# Patient Record
Sex: Female | Born: 1955 | Race: Black or African American | Hispanic: No | Marital: Married | Smoking: Never smoker
Health system: Southern US, Community
[De-identification: ages and names within clinical notes are randomized; demographics above are authoritative.]

## PROBLEM LIST (undated history)

## (undated) DIAGNOSIS — K219 Gastro-esophageal reflux disease without esophagitis: Secondary | ICD-10-CM

## (undated) DIAGNOSIS — I10 Essential (primary) hypertension: Secondary | ICD-10-CM

## (undated) DIAGNOSIS — R519 Headache, unspecified: Secondary | ICD-10-CM

## (undated) DIAGNOSIS — R51 Headache: Secondary | ICD-10-CM

## (undated) DIAGNOSIS — E119 Type 2 diabetes mellitus without complications: Secondary | ICD-10-CM

## (undated) DIAGNOSIS — E785 Hyperlipidemia, unspecified: Secondary | ICD-10-CM

## (undated) DIAGNOSIS — F419 Anxiety disorder, unspecified: Secondary | ICD-10-CM

## (undated) DIAGNOSIS — J45909 Unspecified asthma, uncomplicated: Secondary | ICD-10-CM

## (undated) DIAGNOSIS — M069 Rheumatoid arthritis, unspecified: Secondary | ICD-10-CM

## (undated) HISTORY — PX: OTHER SURGICAL HISTORY: SHX169

## (undated) HISTORY — PX: TUBAL LIGATION: SHX77

## (undated) HISTORY — DX: Anxiety disorder, unspecified: F41.9

## (undated) HISTORY — DX: Hyperlipidemia, unspecified: E78.5

## (undated) HISTORY — PX: CHOLECYSTECTOMY: SHX55

## (undated) HISTORY — PX: LITHOTRIPSY: SUR834

---

## 2015-03-23 ENCOUNTER — Ambulatory Visit: Payer: Self-pay | Admitting: Family Medicine

## 2015-04-23 ENCOUNTER — Encounter: Payer: Self-pay | Admitting: Emergency Medicine

## 2015-04-23 ENCOUNTER — Emergency Department
Admission: EM | Admit: 2015-04-23 | Discharge: 2015-04-23 | Disposition: A | Discharge status: Home / Self Care | Payer: Worker's Compensation | Attending: Emergency Medicine | Admitting: Emergency Medicine

## 2015-04-23 ENCOUNTER — Emergency Department: Payer: Worker's Compensation

## 2015-04-23 DIAGNOSIS — Y9289 Other specified places as the place of occurrence of the external cause: Secondary | ICD-10-CM | POA: Insufficient documentation

## 2015-04-23 DIAGNOSIS — S9781XA Crushing injury of right foot, initial encounter: Secondary | ICD-10-CM | POA: Diagnosis not present

## 2015-04-23 DIAGNOSIS — Y9389 Activity, other specified: Secondary | ICD-10-CM | POA: Diagnosis not present

## 2015-04-23 DIAGNOSIS — Y990 Civilian activity done for income or pay: Secondary | ICD-10-CM | POA: Diagnosis not present

## 2015-04-23 DIAGNOSIS — B351 Tinea unguium: Secondary | ICD-10-CM | POA: Insufficient documentation

## 2015-04-23 DIAGNOSIS — S99921A Unspecified injury of right foot, initial encounter: Secondary | ICD-10-CM | POA: Diagnosis present

## 2015-04-23 DIAGNOSIS — I10 Essential (primary) hypertension: Secondary | ICD-10-CM | POA: Diagnosis not present

## 2015-04-23 DIAGNOSIS — E119 Type 2 diabetes mellitus without complications: Secondary | ICD-10-CM | POA: Diagnosis not present

## 2015-04-23 HISTORY — DX: Essential (primary) hypertension: I10

## 2015-04-23 HISTORY — DX: Type 2 diabetes mellitus without complications: E11.9

## 2015-04-23 MED ORDER — IBUPROFEN 600 MG PO TABS: 600.0000 mg | ORAL_TABLET | Freq: Three times a day (TID) | ORAL | Status: DC | PRN

## 2015-04-23 MED ORDER — IBUPROFEN 600 MG PO TABS
600.0000 mg | ORAL_TABLET | Freq: Once | ORAL | Status: AC
Start: 2015-04-23 — End: 2015-04-23
  Administered 2015-04-23: 600 mg via ORAL
  Filled 2015-04-23: qty 1
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
## 2015-04-23 NOTE — Discharge Instructions (Signed)
Cryotherapy Cryotherapy is when you put ice on your injury. Ice helps lessen pain and puffiness (swelling) after an injury. Ice works the best when you start using it in the first 24 to 48 hours after an injury. HOME CARE  Put a dry or damp towel between the ice pack and your skin.  You may press gently on the ice pack.  Leave the ice on for no more than 10 to 20 minutes at a time.  Check your skin after 5 minutes to make sure your skin is okay.  Rest at least 20 minutes between ice pack uses.  Stop using ice when your skin loses feeling (numbness).  Do not use ice on someone who cannot tell you when it hurts. This includes small children and people with memory problems (dementia). GET HELP RIGHT AWAY IF:  You have white spots on your skin.  Your skin turns blue or pale.  Your skin feels waxy or hard.  Your puffiness gets worse. MAKE SURE YOU:   Understand these instructions.  Will watch your condition.  Will get help right away if you are not doing well or get worse. Document Released: 01/30/2008 Document Revised: 11/05/2011 Document Reviewed: 04/05/2011 [facility] Patient Information 2015 [place], [place]. This information is not intended to replace advice given to you by your health care provider. Make sure you discuss any questions you have with your health care provider.  Crush Injury, Fingers or Toes A crush injury means the fingers or toes are hurt by being squeezed (compressed). HOME CARE  Raise (elevate) the injured part above the level of your heart. Do this as much as you can for the first few days.  Put ice on the injured area.  Put ice in a plastic bag.  Place a towel between your skin and the bag.  Leave the ice on for 15-20 minutes, 03-04 times a day for the first 2 days.  Only take medicine as told by your doctor.  Use the injured part only as told by your doctor.  Change bandages (dressings) as told by your doctor.  Keep all doctor visits as  told. GET HELP RIGHT AWAY IF:   There is redness, puffiness (swelling), or more pain in the injured finger or toe.  Yellowish-white fluid (pus) comes from the wound.  You have a fever.  A bad smell comes from the wound or bandage.  The wound breaks open.  You cannot move the injured finger or toe. MAKE SURE YOU:   Understand these instructions.  Will watch your condition.  Will get help right away if you are not doing well or get worse. Document Released: 01/31/2010 Document Revised: 11/05/2011 Document Reviewed: 12/29/2010 [facility] Patient Information 2015 [place], [place]. This information is not intended to replace advice given to you by your health care provider. Make sure you discuss any questions you have with your health care provider.

## 2015-05-22 ENCOUNTER — Emergency Department
Admission: EM | Admit: 2015-05-22 | Discharge: 2015-05-22 | Disposition: A | Discharge status: Home / Self Care | Payer: 59 | Attending: Emergency Medicine | Admitting: Emergency Medicine

## 2015-05-22 DIAGNOSIS — M791 Myalgia: Secondary | ICD-10-CM | POA: Insufficient documentation

## 2015-05-22 DIAGNOSIS — R112 Nausea with vomiting, unspecified: Secondary | ICD-10-CM | POA: Insufficient documentation

## 2015-05-22 DIAGNOSIS — E119 Type 2 diabetes mellitus without complications: Secondary | ICD-10-CM | POA: Diagnosis not present

## 2015-05-22 DIAGNOSIS — I10 Essential (primary) hypertension: Secondary | ICD-10-CM | POA: Diagnosis not present

## 2015-05-22 DIAGNOSIS — R197 Diarrhea, unspecified: Secondary | ICD-10-CM | POA: Diagnosis present

## 2015-05-22 DIAGNOSIS — F419 Anxiety disorder, unspecified: Secondary | ICD-10-CM | POA: Insufficient documentation

## 2015-05-22 DIAGNOSIS — R109 Unspecified abdominal pain: Secondary | ICD-10-CM | POA: Insufficient documentation

## 2015-05-22 LAB — CBC
HCT: 38.2 % (ref 35.0–47.0)
Hemoglobin: 12.5 g/dL (ref 12.0–16.0)
MCH: 25.4 pg — ABNORMAL LOW (ref 26.0–34.0)
MCHC: 32.8 g/dL (ref 32.0–36.0)
MCV: 77.2 fL — ABNORMAL LOW (ref 80.0–100.0)
Platelets: 267 10*3/uL (ref 150–440)
RBC: 4.94 MIL/uL (ref 3.80–5.20)
RDW: 13.6 % (ref 11.5–14.5)
WBC: 8.7 10*3/uL (ref 3.6–11.0)

## 2015-05-22 LAB — COMPREHENSIVE METABOLIC PANEL
ALT: 23 U/L (ref 14–54)
AST: 30 U/L (ref 15–41)
Albumin: 4 g/dL (ref 3.5–5.0)
Alkaline Phosphatase: 94 U/L (ref 38–126)
Anion gap: 7 (ref 5–15)
BUN: 17 mg/dL (ref 6–20)
CO2: 28 mmol/L (ref 22–32)
Calcium: 9 mg/dL (ref 8.9–10.3)
Chloride: 103 mmol/L (ref 101–111)
Creatinine, Ser: 0.91 mg/dL (ref 0.44–1.00)
GFR calc Af Amer: >60 mL/min (ref >60)
GFR calc non Af Amer: >60 mL/min (ref >60)
Glucose, Bld: 162 mg/dL — ABNORMAL HIGH (ref 65–99)
Potassium: 3.4 mmol/L — ABNORMAL LOW (ref 3.5–5.1)
Sodium: 138 mmol/L (ref 135–145)
Total Bilirubin: 0.7 mg/dL (ref 0.3–1.2)
Total Protein: 8.2 g/dL — ABNORMAL HIGH (ref 6.5–8.1)

## 2015-05-22 MED ORDER — ONDANSETRON HCL 4 MG/2ML IJ SOLN
4.0000 mg | Freq: Once | INTRAMUSCULAR | Status: AC
  Administered 2015-05-22: 4 mg via INTRAVENOUS
  Filled 2015-05-22: qty 2

## 2015-05-22 MED ORDER — SODIUM CHLORIDE 0.9 % IV SOLN
1000.0000 mL | Freq: Once | INTRAVENOUS | Status: AC
  Administered 2015-05-22: 1000 mL via INTRAVENOUS

## 2015-05-22 NOTE — ED Provider Notes (Signed)
[facility] Emergency Department Provider Note  ____________________________________________  Time seen: On arrival  I have reviewed the triage vital signs and the nursing notes.   HISTORY  Chief Complaint Nausea; Emesis; and Diarrhea    HPI April Bauer is a 59 y.o. female who presents with complaints of nausea and abdominal cramping and diarrhea. She reports yesterday she developed nausea and then today developed diarrhea which is been frequent and watery. She denies fevers chills. She has had some myalgias. No sick contacts.She does have a history of diabetes. No recent antibiotics. No exposure to C. difficile     Past Medical History  Diagnosis Date  . Diabetes mellitus without complication   . Hypertension     There are no active problems to display for this patient.   Past Surgical History  Procedure Laterality Date  . Cholecystectomy    . Rotary cuff    . Tubal ligation      Current Outpatient Rx  Name  Route  Sig  Dispense  Refill  . ibuprofen (ADVIL,MOTRIN) 600 MG tablet   Oral   Take 1 tablet (600 mg total) by mouth every 8 (eight) hours as needed for moderate pain.   21 tablet   0     Allergies Tramadol and Vicodin  No family history on file.  Social History Social History  Substance Use Topics  . Smoking status: Never Smoker   . Smokeless tobacco: Not on file  . Alcohol Use: No    Review of Systems  Constitutional: Negative for fever. Eyes: Negative for visual changes. ENT: Negative for sore throat Cardiovascular: Negative for chest pain. Respiratory: Negative for shortness of breath. Gastrointestinal: Positive for nausea and diarrhea Genitourinary: Negative for dysuria. Musculoskeletal: Negative for back pain. Skin: Negative for rash. Neurological: Negative for headaches or focal weakness Psychiatric: Mild anxiety    ____________________________________________   PHYSICAL EXAM:  VITAL SIGNS: ED  Triage Vitals  Enc Vitals Group     BP 05/22/15 2012 173/89 mmHg     Pulse Rate 05/22/15 2012 79     Resp 05/22/15 2012 18     Temp 05/22/15 2012 98.2 F (36.8 C)     Temp Source 05/22/15 2012 Oral     SpO2 05/22/15 2012 97 %     Weight 05/22/15 2012 210 lb (95.255 kg)     Height 05/22/15 2012 5\' 6"  (1.676 m)     Head Cir --      Peak Flow --      Pain Score 05/22/15 2034 8     Pain Loc --      Pain Edu? --      Excl. in GC? --      Constitutional: Alert and oriented. Well appearing and in no distress. Eyes: Conjunctivae are normal.  ENT   Head: Normocephalic and atraumatic.   Mouth/Throat: Mucous membranes are moist. Cardiovascular: Normal rate, regular rhythm. Normal and symmetric distal pulses are present in all extremities. No murmurs, rubs, or gallops. Respiratory: Normal respiratory effort without tachypnea nor retractions. Breath sounds are clear and equal bilaterally.  Gastrointestinal: Soft and non-tender in all quadrants. No distention. There is no CVA tenderness. Genitourinary: deferred Musculoskeletal: Nontender with normal range of motion in all extremities. No lower extremity tenderness nor edema. Neurologic:  Normal speech and language. No gross focal neurologic deficits are appreciated. Skin:  Skin is warm, dry and intact. No rash noted. Psychiatric: Mood and affect are normal. Patient exhibits appropriate insight and judgment.  ____________________________________________    LABS (pertinent positives/negatives)  Labs Reviewed  CBC - Abnormal; Notable for the following:    MCV 77.2 (*)    MCH 25.4 (*)    All other components within normal limits  COMPREHENSIVE METABOLIC PANEL - Abnormal; Notable for the following:    Potassium 3.4 (*)    Glucose, Bld 162 (*)    Total Protein 8.2 (*)    All other components within normal limits    ____________________________________________   EKG  None  ____________________________________________     RADIOLOGY I have personally reviewed any xrays that were ordered on this patient: None  ____________________________________________   PROCEDURES  Procedure(s) performed: none  Critical Care performed: none  ____________________________________________   INITIAL IMPRESSION / ASSESSMENT AND PLAN / ED COURSE  Pertinent labs & imaging results that were available during my care of the patient were reviewed by me and considered in my medical decision making (see chart for details).  Patient's history of present illness is most consistent with a viral gastroenteritis. She has no tenderness to palpation and her exam is benign. Her vital signs are normal. Her lab work is unremarkable. She feels significant better after fluids and Zofran. I will discharge patient with Zofran prescription and PCP follow-up as needed  ____________________________________________   FINAL CLINICAL IMPRESSION(S) / ED DIAGNOSES  Final diagnoses:  Diarrhea     Jene Every, MD 05/22/15 2245

## 2015-05-22 NOTE — Discharge Instructions (Signed)
Diarrhea °Diarrhea is watery poop (stool). It can make you feel weak, tired, thirsty, or give you a dry mouth (signs of dehydration). Watery poop is a sign of another problem, most often an infection. It often lasts 2-3 days. It can last longer if it is a sign of something serious. Take care of yourself as told by your doctor. °HOME CARE  °· Drink 1 cup (8 ounces) of fluid each time you have watery poop. °· Do not drink the following fluids: °¨ Those that contain simple sugars (fructose, glucose, galactose, lactose, sucrose, maltose). °¨ Sports drinks. °¨ Fruit juices. °¨ Whole milk products. °¨ Sodas. °¨ Drinks with caffeine (coffee, tea, soda) or alcohol. °· Oral rehydration solution may be used if the doctor says it is okay. You may make your own solution. Follow this recipe: °¨  - teaspoon table salt. °¨ ¾ teaspoon baking soda. °¨  teaspoon salt substitute containing potassium chloride. °¨ 1 tablespoons sugar. °¨ 1 liter (34 ounces) of water. °· Avoid the following foods: °¨ High fiber foods, such as raw fruits and vegetables. °¨ Nuts, seeds, and whole grain breads and cereals. °¨  Those that are sweetened with sugar alcohols (xylitol, sorbitol, mannitol). °· Try eating the following foods: °¨ Starchy foods, such as rice, toast, pasta, low-sugar cereal, oatmeal, baked potatoes, crackers, and bagels. °¨ Bananas. °¨ Applesauce. °· Eat probiotic-rich foods, such as yogurt and milk products that are fermented. °· Wash your hands well after each time you have watery poop. °· Only take medicine as told by your doctor. °· Take a warm bath to help lessen burning or pain from having watery poop. °GET HELP RIGHT AWAY IF:  °· You cannot drink fluids without throwing up (vomiting). °· You keep throwing up. °· You have blood in your poop, or your poop looks black and tarry. °· You do not pee (urinate) in 6-8 hours, or there is only a small amount of very dark pee. °· You have belly (abdominal) pain that gets worse or stays  in the same spot (localizes). °· You are weak, dizzy, confused, or light-headed. °· You have a very bad headache. °· Your watery poop gets worse or does not get better. °· You have a fever or lasting symptoms for more than 2-3 days. °· You have a fever and your symptoms suddenly get worse. °MAKE SURE YOU:  °· Understand these instructions. °· Will watch your condition. °· Will get help right away if you are not doing well or get worse. °Document Released: 01/30/2008 Document Revised: 12/28/2013 Document Reviewed: 04/20/2012 °[employer]® Patient Information ©2015 [employer]. This information is not intended to replace advice given to you by your health care provider. Make sure you discuss any questions you have with your health care provider. ° °

## 2015-05-22 NOTE — ED Notes (Signed)
Patient reports nausea for 2 days, then started vomiting.  Today with multiple bouts of diarrhea.

## 2015-06-06 ENCOUNTER — Ambulatory Visit (INDEPENDENT_AMBULATORY_CARE_PROVIDER_SITE_OTHER): Payer: 59 | Admitting: Family Medicine

## 2015-06-06 ENCOUNTER — Encounter: Payer: Self-pay | Admitting: Family Medicine

## 2015-06-06 VITALS — BP 172/90 | HR 70 | Temp 97.6°F | Ht 64.4 in | Wt 199.0 lb

## 2015-06-06 DIAGNOSIS — F419 Anxiety disorder, unspecified: Secondary | ICD-10-CM | POA: Diagnosis not present

## 2015-06-06 DIAGNOSIS — E119 Type 2 diabetes mellitus without complications: Secondary | ICD-10-CM | POA: Diagnosis not present

## 2015-06-06 DIAGNOSIS — R197 Diarrhea, unspecified: Secondary | ICD-10-CM | POA: Diagnosis not present

## 2015-06-06 DIAGNOSIS — Z113 Encounter for screening for infections with a predominantly sexual mode of transmission: Secondary | ICD-10-CM | POA: Diagnosis not present

## 2015-06-06 DIAGNOSIS — E785 Hyperlipidemia, unspecified: Secondary | ICD-10-CM

## 2015-06-06 DIAGNOSIS — I10 Essential (primary) hypertension: Secondary | ICD-10-CM | POA: Diagnosis not present

## 2015-06-06 LAB — MICROALBUMIN, URINE WAIVED
Creatinine, Urine Waived: 200 mg/dL (ref 10–300)
Microalb, Ur Waived: 80 mg/L — ABNORMAL HIGH (ref 0–19)

## 2015-06-06 LAB — LIPID PANEL PICCOLO, WAIVED
Chol/HDL Ratio Piccolo,Waive: 5.4 mg/dL — ABNORMAL HIGH
Cholesterol Piccolo, Waived: 178 mg/dL (ref <200)
HDL Chol Piccolo, Waived: 33 mg/dL — ABNORMAL LOW (ref >59)
LDL Chol Calc Piccolo Waived: 93 mg/dL (ref <100)
Triglycerides Piccolo,Waived: 263 mg/dL — ABNORMAL HIGH (ref <150)
VLDL Chol Calc Piccolo,Waive: 53 mg/dL — ABNORMAL HIGH (ref <30)

## 2015-06-06 LAB — BAYER DCA HB A1C WAIVED: HB A1C (BAYER DCA - WAIVED): 8 % — ABNORMAL HIGH (ref <7.0)

## 2015-06-06 MED ORDER — BLOOD GLUCOSE MONITOR KIT: PACK | Status: DC

## 2015-06-06 MED ORDER — GLIPIZIDE ER 10 MG PO TB24: 10.0000 mg | ORAL_TABLET | Freq: Every day | ORAL | Status: DC

## 2015-06-06 NOTE — Assessment & Plan Note (Signed)
Stable at this time. Continue to monitor. Call with any changes.

## 2015-06-06 NOTE — Assessment & Plan Note (Addendum)
A1c is 8.0 today, but she has been out of her glipizide. Work on diet and exercise. Monitor. Recheck 3 months.

## 2015-06-06 NOTE — Assessment & Plan Note (Signed)
Under good control on current regimen at this time. Continue current regimen. Continue to monitor.

## 2015-06-06 NOTE — Progress Notes (Signed)
BP 177/102 mmHg  Pulse 70  Temp(Src) 97.6 F (36.4 C)  Ht 5' 4.4" (1.636 m)  Wt 199 lb (90.266 kg)  BMI 33.73 kg/m2  SpO2 99%   Subjective:    Patient ID: April Bauer, female    DOB: 08/21/1956, 59 y.o.   MRN: [id number]  HPI: April Bauer is a 59 y.o. female has not seen her doctor in the past 6 months  Chief Complaint  Patient presents with  . Establish Care    No problems, patient will schedule a CPE   HYPERTENSION / HYPERLIPIDEMIA Satisfied with current treatment? yes- hasn't taken her medicine yet today Duration of hypertension: chronic BP monitoring frequency: not checking BP medication side effects: no Duration of hyperlipidemia: chronic Cholesterol medication side effects: no Cholesterol supplements: none Medication compliance: fair Aspirin: yes Recent stressors: no Recurrent headaches: no Visual changes: yes Palpitations: no Dyspnea: no Chest pain: no- has a lot of gas Lower extremity edema: yes after an injury Dizzy/lightheaded: no  DIABETES Hypoglycemic episodes: yes Polydipsia/polyuria: no Visual disturbance: yes Chest pain: no Paresthesias: yes Glucose Monitoring: no Taking Insulin?: no Blood Pressure Monitoring: not checking Retinal Examination: Up to Date Foot Exam: Up to Date Diabetic Education: Completed Pneumovax: refused Influenza: refused Aspirin: yes  ABDOMINAL ISSUES Duration: for the last 4-5 months 1-2x a month has diarrhea Nature: bloating, sharp and cramping Location: LLQ  Severity: moderate  Radiation: no Episode duration: 2-3 days Frequency: a few times a month Alleviating factors: water Aggravating factors: anything Treatments attempted: none Constipation: no Diarrhea: yes Episodes of diarrhea/day: 4x a day Mucous in the stool: no Heartburn: yes Bloating:yes Flatulence: yes Nausea: yes Vomiting: no Melena or hematochezia: no Rash: no Jaundice: no Fever: no Weight loss: yes about 10 lbs  Relevant past medical,  surgical, family and social history reviewed and updated as indicated. Interim medical history since our last visit reviewed. Allergies and medications reviewed and updated.  Review of Systems  Constitutional: Negative.   HENT: Negative.   Respiratory: Negative.   Cardiovascular: Negative.   Gastrointestinal: Positive for abdominal pain, diarrhea and abdominal distention. Negative for nausea, vomiting, constipation, blood in stool, anal bleeding and rectal pain.  Psychiatric/Behavioral: Negative.    Per HPI unless specifically indicated above     Objective:    BP 177/102 mmHg  Pulse 70  Temp(Src) 97.6 F (36.4 C)  Ht 5' 4.4" (1.636 m)  Wt 199 lb (90.266 kg)  BMI 33.73 kg/m2  SpO2 99%  Wt Readings from Last 3 Encounters:  06/06/15 199 lb (90.266 kg)  05/22/15 210 lb (95.255 kg)  04/23/15 200 lb (90.719 kg)    Physical Exam  Constitutional: She is oriented to person, place, and time. She appears well-developed and well-nourished. No distress.  HENT:  Head: Normocephalic and atraumatic.  Right Ear: Hearing normal.  Left Ear: Hearing normal.  Nose: Nose normal.  Eyes: Conjunctivae and lids are normal. Right eye exhibits no discharge. Left eye exhibits no discharge. No scleral icterus.  Cardiovascular: Normal rate, regular rhythm, normal heart sounds and intact distal pulses.  Exam reveals no gallop and no friction rub.   No murmur heard. Pulmonary/Chest: Effort normal and breath sounds normal. No respiratory distress. She has no wheezes. She has no rales. She exhibits no tenderness.  Abdominal: Soft. Bowel sounds are normal. She exhibits no distension and no mass. There is no tenderness. There is no rebound and no guarding.  Musculoskeletal: Normal range of motion.  Neurological: She is alert and oriented to  person, place, and time.  Skin: Skin is warm, dry and intact. No rash noted. She is not diaphoretic. No erythema. No pallor.  Psychiatric: She has a normal mood and  affect. Her speech is normal and behavior is normal. Judgment and thought content normal. Cognition and memory are normal.  Nursing note and vitals reviewed.   Results for orders placed or performed during the hospital encounter of 05/22/15  CBC  Result Value Ref Range   WBC 8.7 3.6 - 11.0 K/uL   RBC 4.94 3.80 - 5.20 MIL/uL   Hemoglobin 12.5 12.0 - 16.0 g/dL   HCT 74.2 59.5 - 63.8 %   MCV 77.2 (L) 80.0 - 100.0 fL   MCH 25.4 (L) 26.0 - 34.0 pg   MCHC 32.8 32.0 - 36.0 g/dL   RDW 75.6 43.3 - 29.5 %   Platelets 267 150 - 440 K/uL  Comprehensive metabolic panel  Result Value Ref Range   Sodium 138 135 - 145 mmol/L   Potassium 3.4 (L) 3.5 - 5.1 mmol/L   Chloride 103 101 - 111 mmol/L   CO2 28 22 - 32 mmol/L   Glucose, Bld 162 (H) 65 - 99 mg/dL   BUN 17 6 - 20 mg/dL   Creatinine, Ser 1.88 0.44 - 1.00 mg/dL   Calcium 9.0 8.9 - 41.6 mg/dL   Total Protein 8.2 (H) 6.5 - 8.1 g/dL   Albumin 4.0 3.5 - 5.0 g/dL   AST 30 15 - 41 U/L   ALT 23 14 - 54 U/L   Alkaline Phosphatase 94 38 - 126 U/L   Total Bilirubin 0.7 0.3 - 1.2 mg/dL   GFR calc non Af Amer >60 >60 mL/min   GFR calc Af Amer >60 >60 mL/min   Anion gap 7 5 - 15      Assessment & Plan:   Problem List Items Addressed This Visit      Cardiovascular and Mediastinum   Hypertension - Primary    Did not take her medicine today. Will check BP at home and call if high. Restart medicine and recheck next visit.       Relevant Medications   cloNIDine (CATAPRES) 0.1 MG tablet   lisinopril-hydrochlorothiazide (PRINZIDE,ZESTORETIC) 20-25 MG tablet   simvastatin (ZOCOR) 20 MG tablet   aspirin EC 81 MG tablet   Other Relevant Orders   CBC with Differential/Platelet   Comprehensive metabolic panel   Microalbumin, Urine Waived     Endocrine   Diabetes mellitus without complication (HCC)    A1c is 8.0 today, but she has been out of her glipizide. Work on diet and exercise. Monitor. Recheck 3 months.       Relevant Medications    Liraglutide (VICTOZA) 18 MG/3ML SOPN   metFORMIN (GLUCOPHAGE) 850 MG tablet   lisinopril-hydrochlorothiazide (PRINZIDE,ZESTORETIC) 20-25 MG tablet   simvastatin (ZOCOR) 20 MG tablet   aspirin EC 81 MG tablet   glipiZIDE (GLUCOTROL XL) 10 MG 24 hr tablet   Other Relevant Orders   CBC with Differential/Platelet   Comprehensive metabolic panel   TSH   Microalbumin, Urine Waived   Bayer DCA Hb A1c Waived     Other   Hyperlipidemia    Under good control on current regimen at this time. Continue current regimen. Continue to monitor.       Relevant Medications   cloNIDine (CATAPRES) 0.1 MG tablet   lisinopril-hydrochlorothiazide (PRINZIDE,ZESTORETIC) 20-25 MG tablet   simvastatin (ZOCOR) 20 MG tablet   aspirin EC 81 MG tablet  Other Relevant Orders   Comprehensive metabolic panel   Lipid Panel Piccolo, Waived   Anxiety    Stable at this time. Continue to monitor. Call with any changes.       Relevant Orders   CBC with Differential/Platelet   TSH    Other Visit Diagnoses    Routine screening for STI (sexually transmitted infection)        Checking labs today. Await results.     Relevant Orders    Hepatitis C Antibody    HIV antibody    Diarrhea, unspecified type        Of unknown cause. Will keep diary. Will check labs. Will try BRAT diet. Follow up in 1 month, if not better, stool studies. Will obtain records of colonoscopy.        Follow up plan: Return 1-3 months, for Physical- Needs Records releases.

## 2015-06-06 NOTE — Patient Instructions (Addendum)
Keep track of your belly pain and diarrhea- keep a journal to see if it's associated with any food or activity. We'll check your blood work and wait to see if that has anything to do with it. I've included a gentle diet below.   Diabetes and Exercise Diabetes mellitus is a common, chronic disease in which a person's blood sugar (glucose) levels are often too high. Getting exercise on a regular basis is an important part of diabetes treatment. WHY SHOULD I EXERCISE REGULARLY IF I HAVE DIABETES? Exercising regularly provides many benefits for people who have diabetes. Some of these benefits include:  Lowering your blood glucose level.  Maintaining a healthy body weight and structure.  Reducing your risk of heart disease by:  Lowering your LDL cholesterol levels. LDL is sometimes called bad cholesterol.  Lowering your triglyceride levels.  Raising your HDL cholesterol levels. HDL is sometimes called good cholesterol.  Decreasing your blood pressure.  Lowering your hemoglobin A1C levels. A1C is a blood test that determines your average blood glucose level over a 58-month period. Doing resistance exercises on a regular basis can help to lower these levels.  Improving your body's ability to use insulin and glucose. WHAT TYPES OF EXERCISE ARE RECOMMENDED FOR PEOPLE WHO HAVE DIABETES? A combination of resistance exercises and aerobic exercise is recommended for people who have diabetes. Resistance exercises are those that you do with free weights or weight machines. Aerobic exercise includes any exercise that raises your heart rate and breathing rate for a sustained amount of time. This can include activities such as:  Brisk walking.  Water or dry-land aerobics.  Bicycling or riding a stationary bike.  Jogging.  Dancing.  Hiking.  Moderate to vigorous gardening. Ask your health care provider what types of exercise are safe for you. HOW LONG AND HOW OFTEN SHOULD I EXERCISE? The  American Diabetes Association and the [place] Department of Health recommend the following:  Children who have diabetes or are at risk of developing diabetes (have prediabetes) should get 1 hour or more of exercise every day.  Adults who have diabetes should exercise with moderate intensity for 150 minutes or more per week. Moderate-intensity exercise is any exercise that raises your heart rate to 50-70% of your maximum heart rate. Ask your health care provider how to calculate this.  Adults who have diabetes should spread exercise over at least 3 days per week, but they should not go more than 2 days in a row without exercising.  Adults who have diabetes should do resistance exercise at least 2 days per week.  Adults who have diabetes should avoid sitting for more than 90 minutes at a time. WHAT SHOULD I DO BEFORE I START AN EXERCISE PROGRAM?  Talk with your health care provider before you start a new exercise program. If you have, or are at risk for, certain medical conditions, you may need to have a physical exam and testing. Testing may include:  A chest X-ray.  An electrocardiogram (ECG). This test checks the electrical functioning of your heart.  Blood tests.  If you have type 1 diabetes, talk with your health care provider about managing your blood glucose levels with insulin before, during, and after exercise.  Understand that exercise affects blood glucose levels differently depending on how long you exercise and depending on other factors such as whether you are sick. Be ready to treat high blood glucose (hyperglycemia) and low blood glucose (hypoglycemia) right away if either occurs during or after exercise.  Talk with your health care provider about diabetes-related problems that can occur during exercise. Your health care provider can help you to decide on an exercise plan that allows you to avoid these problems. This plan might include instructions about choosing the proper  footwear for exercise and staying well hydrated during and after exercise. HOW CAN I MANAGE MY BLOOD GLUCOSE LEVEL WHILE I EXERCISE?  Eat 1-3 hours before you exercise.  Check your blood glucose level immediately before and after exercise.  Do not start exercising if:  Your blood glucose is more than 250 mg/dL andyou do not feel well.  You have ketones in your urine.  Your blood glucose is less than 100 mg/dL.  If you do not feel well while exercising, take a break and check your blood glucose.  Stop exercising if you find that your blood glucose has dropped below 100 mg/dL. If this occurs, do the following:  Eat a 15-gram to 20-gram carbohydrate-rich snack.  Recheck your blood glucose level.  If your blood glucose level does not rise above 100 mg/dL, have another 10-GYIR to 20-gram carbohydrate snack.  Stop exercising if youfindthat your blood glucose has risen above 250 mg/dL while exercising.  Do not go on with your workout until:  Your blood glucose level rises above 100 mg/dL if it was below this level.  Your blood glucose level drops below 250 mg/dL if it was above this level.  If you take insulin, you may need to alter your insulin schedule on the days that you exercise. This depends on how your blood glucose responds to exercise. Ask your health care provider how to do this.  Drink fluids during and after exercise to avoid dehydration. For exercise that lasts a long time, use a sports drink to maintain your glucose level. SEEK MEDICAL CARE IF:  You have stopped exercising because of hypoglycemia and your blood glucose does not rise above 100 mg/dL after you have two 48-NIOE to 20-gram carbohydrate-rich snacks.  You notice a loss of sensation in your feet during or after exercise.  You have increased numbness, tingling, or pins-and-needles sensations after exercise.  You have a fast, irregular heartbeat (palpitations) during or after exercise.  Your exercise  tolerance gets worse.  You have dizzy spells or feel faint for brief periods during or after exercise. SEEK IMMEDIATE MEDICAL CARE IF:  You have chest pain during or after exercise.  You pass out (lose consciousness) during or after exercise.  You have the following in addition to hyperglycemia:  Fatigue.  Dry or flushed skin.  Difficulty breathing.  Confusion.  Nausea, vomiting, or pain in the abdomen.  Fruity-smelling breath.   This information is not intended to replace advice given to you by your health care provider. Make sure you discuss any questions you have with your health care provider.   Document Released: 08/13/2005 Document Revised: 05/04/2015 Document Reviewed: 10/12/2014 Elsevier Interactive Patient Education 2016 [employer]. Food Choices to Help Relieve Diarrhea, Adult When you have diarrhea, the foods you eat and your eating habits are very important. Choosing the right foods and drinks can help relieve diarrhea. Also, because diarrhea can last up to 7 days, you need to replace lost fluids and electrolytes (such as sodium, potassium, and chloride) in order to help prevent dehydration.  WHAT GENERAL GUIDELINES DO I NEED TO FOLLOW?  Slowly drink 1 cup (8 oz) of fluid for each episode of diarrhea. If you are getting enough fluid, your urine will be clear or  pale yellow.  Eat starchy foods. Some good choices include white rice, white toast, pasta, low-fiber cereal, baked potatoes (without the skin), saltine crackers, and bagels.  Avoid large servings of any cooked vegetables.  Limit fruit to two servings per day. A serving is  cup or 1 small piece.  Choose foods with less than 2 g of fiber per serving.  Limit fats to less than 8 tsp (38 g) per day.  Avoid fried foods.  Eat foods that have probiotics in them. Probiotics can be found in certain dairy products.  Avoid foods and beverages that may increase the speed at which food moves through the stomach  and intestines (gastrointestinal tract). Things to avoid include:  High-fiber foods, such as dried fruit, raw fruits and vegetables, nuts, seeds, and whole grain foods.  Spicy foods and high-fat foods.  Foods and beverages sweetened with high-fructose corn syrup, honey, or sugar alcohols such as xylitol, sorbitol, and mannitol. WHAT FOODS ARE RECOMMENDED? Grains White rice. White, [place], or pita breads (fresh or toasted), including plain rolls, buns, or bagels. White pasta. Saltine, soda, or graham crackers. Pretzels. Low-fiber cereal. Cooked cereals made with water (such as cornmeal, farina, or cream cereals). Plain muffins. Matzo. Melba toast. Zwieback.  Vegetables Potatoes (without the skin). Strained tomato and vegetable juices. Most well-cooked and canned vegetables without seeds. Tender lettuce. Fruits Cooked or canned applesauce, apricots, cherries, fruit cocktail, grapefruit, peaches, pears, or plums. Fresh bananas, apples without skin, cherries, grapes, cantaloupe, grapefruit, peaches, oranges, or plums.  Meat and Other Protein Products Baked or boiled chicken. Eggs. Tofu. Fish. Seafood. Smooth peanut butter. Ground or well-cooked tender beef, ham, veal, lamb, pork, or poultry.  Dairy Plain yogurt, kefir, and unsweetened liquid yogurt. Lactose-free milk, buttermilk, or soy milk. Plain hard cheese. Beverages Sport drinks. Clear broths. Diluted fruit juices (except prune). Regular, caffeine-free sodas such as ginger ale. Water. Decaffeinated teas. Oral rehydration solutions. Sugar-free beverages not sweetened with sugar alcohols. Other Bouillon, broth, or soups made from recommended foods.  The items listed above may not be a complete list of recommended foods or beverages. Contact your dietitian for more options. WHAT FOODS ARE NOT RECOMMENDED? Grains Whole grain, whole wheat, bran, or rye breads, rolls, pastas, crackers, and cereals. Wild or brown rice. Cereals that contain more  than 2 g of fiber per serving. Corn tortillas or taco shells. Cooked or dry oatmeal. Granola. Popcorn. Vegetables Raw vegetables. Cabbage, broccoli, Brussels sprouts, artichokes, baked beans, beet greens, corn, kale, legumes, peas, sweet potatoes, and yams. Potato skins. Cooked spinach and cabbage. Fruits Dried fruit, including raisins and dates. Raw fruits. Stewed or dried prunes. Fresh apples with skin, apricots, mangoes, pears, raspberries, and strawberries.  Meat and Other Protein Products Chunky peanut butter. Nuts and seeds. Beans and lentils. Tomasa Blase.  Dairy High-fat cheeses. Milk, chocolate milk, and beverages made with milk, such as milk shakes. Cream. Ice cream. Sweets and Desserts Sweet rolls, doughnuts, and sweet breads. Pancakes and waffles. Fats and Oils Butter. Cream sauces. Margarine. Salad oils. Plain salad dressings. Olives. Avocados.  Beverages Caffeinated beverages (such as coffee, tea, soda, or energy drinks). Alcoholic beverages. Fruit juices with pulp. Prune juice. Soft drinks sweetened with high-fructose corn syrup or sugar alcohols. Other Coconut. Hot sauce. Chili powder. Mayonnaise. Gravy. Cream-based or milk-based soups.  The items listed above may not be a complete list of foods and beverages to avoid. Contact your dietitian for more information. WHAT SHOULD I DO IF I BECOME DEHYDRATED? Diarrhea can sometimes lead to  dehydration. Signs of dehydration include dark urine and dry mouth and skin. If you think you are dehydrated, you should rehydrate with an oral rehydration solution. These solutions can be purchased at pharmacies, retail stores, or online.  Drink -1 cup (120-240 mL) of oral rehydration solution each time you have an episode of diarrhea. If drinking this amount makes your diarrhea worse, try drinking smaller amounts more often. For example, drink 1-3 tsp (5-15 mL) every 5-10 minutes.  A general rule for staying hydrated is to drink 1-2 L of fluid per day.  Talk to your health care provider about the specific amount you should be drinking each day. Drink enough fluids to keep your urine clear or pale yellow.   This information is not intended to replace advice given to you by your health care provider. Make sure you discuss any questions you have with your health care provider.   Document Released: 11/03/2003 Document Revised: 09/03/2014 Document Reviewed: 07/06/2013 Elsevier Interactive Patient Education [employer]

## 2015-06-06 NOTE — Assessment & Plan Note (Signed)
Did not take her medicine today. Will check BP at home and call if high. Restart medicine and recheck next visit.

## 2015-06-07 LAB — CBC WITH DIFFERENTIAL/PLATELET
Basophils Absolute: 0.1 10*3/uL (ref 0.0–0.2)
Basos: 1 %
EOS (ABSOLUTE): 0.5 10*3/uL — ABNORMAL HIGH (ref 0.0–0.4)
Eos: 6 %
Hematocrit: 37.2 % (ref 34.0–46.6)
Hemoglobin: 11.9 g/dL (ref 11.1–15.9)
Immature Grans (Abs): 0 10*3/uL (ref 0.0–0.1)
Immature Granulocytes: 0 %
Lymphocytes Absolute: 3.1 10*3/uL (ref 0.7–3.1)
Lymphs: 36 %
MCH: 25.3 pg — ABNORMAL LOW (ref 26.6–33.0)
MCHC: 32 g/dL (ref 31.5–35.7)
MCV: 79 fL (ref 79–97)
Monocytes Absolute: 0.7 10*3/uL (ref 0.1–0.9)
Monocytes: 8 %
Neutrophils Absolute: 4.3 10*3/uL (ref 1.4–7.0)
Neutrophils: 49 %
Platelets: 364 10*3/uL (ref 150–379)
RBC: 4.71 x10E6/uL (ref 3.77–5.28)
RDW: 13.1 % (ref 12.3–15.4)
WBC: 8.6 10*3/uL (ref 3.4–10.8)

## 2015-06-07 LAB — COMPREHENSIVE METABOLIC PANEL
ALT: 26 IU/L (ref 0–32)
AST: 15 IU/L (ref 0–40)
Albumin/Globulin Ratio: 1.3 (ref 1.1–2.5)
Albumin: 4.1 g/dL (ref 3.5–5.5)
Alkaline Phosphatase: 107 IU/L (ref 39–117)
BUN/Creatinine Ratio: 18 (ref 9–23)
BUN: 14 mg/dL (ref 6–24)
Bilirubin Total: 0.3 mg/dL (ref 0.0–1.2)
CO2: 28 mmol/L (ref 18–29)
Calcium: 9.5 mg/dL (ref 8.7–10.2)
Chloride: 101 mmol/L (ref 97–108)
Creatinine, Ser: 0.76 mg/dL (ref 0.57–1.00)
GFR calc Af Amer: 100 mL/min/{1.73_m2} (ref >59)
GFR calc non Af Amer: 87 mL/min/{1.73_m2} (ref >59)
Globulin, Total: 3.1 g/dL (ref 1.5–4.5)
Glucose: 151 mg/dL — ABNORMAL HIGH (ref 65–99)
Potassium: 3.9 mmol/L (ref 3.5–5.2)
Sodium: 142 mmol/L (ref 134–144)
Total Protein: 7.2 g/dL (ref 6.0–8.5)

## 2015-06-07 LAB — TSH: TSH: 0.952 u[IU]/mL (ref 0.450–4.500)

## 2015-06-07 LAB — HEPATITIS C ANTIBODY: Hep C Virus Ab: <0.1 s/co ratio (ref 0.0–0.9)

## 2015-06-07 LAB — HIV ANTIBODY (ROUTINE TESTING W REFLEX): HIV Screen 4th Generation wRfx: NONREACTIVE

## 2015-06-08 ENCOUNTER — Encounter: Payer: Self-pay | Admitting: Family Medicine

## 2015-06-09 ENCOUNTER — Telehealth: Payer: Self-pay | Admitting: Family Medicine

## 2015-06-09 ENCOUNTER — Other Ambulatory Visit: Payer: Self-pay

## 2015-06-09 NOTE — Telephone Encounter (Signed)
Please send meter and test strip rx to [facility] [place], they say they don't have it

## 2015-06-09 NOTE — Telephone Encounter (Signed)
Called meter and test strips into [facility] [place], Left on the answering machine.

## 2015-06-20 ENCOUNTER — Other Ambulatory Visit: Payer: Self-pay

## 2015-06-20 NOTE — Telephone Encounter (Signed)
Patient requests  One Touch ultra strip blue Lisinopril/hctz  Clonidine. Requests all medication w/ 90 day supply. (mail delivery)  Last visit was 06/06/2015

## 2015-06-20 NOTE — Telephone Encounter (Signed)
Need to know dose of her catapres. Then I'll refill

## 2015-06-21 MED ORDER — CLONIDINE HCL 0.1 MG PO TABS: 0.1000 mg | ORAL_TABLET | Freq: Two times a day (BID) | ORAL | Status: DC

## 2015-06-21 MED ORDER — GLUCOSE BLOOD VI STRP: ORAL_STRIP | Status: DC

## 2015-06-21 MED ORDER — LISINOPRIL-HYDROCHLOROTHIAZIDE 20-25 MG PO TABS: 1.0000 | ORAL_TABLET | Freq: Every day | ORAL | Status: DC

## 2015-06-21 NOTE — Telephone Encounter (Signed)
Dose added to order.  Dose is 0.1mg 

## 2015-07-18 ENCOUNTER — Telehealth: Payer: Self-pay

## 2015-07-18 ENCOUNTER — Other Ambulatory Visit: Payer: Self-pay

## 2015-07-18 NOTE — Telephone Encounter (Signed)
LAST VISIT: 06/06/2015 APPT: 08/02/2015  I saw where April Bauer had an open encounter. I talked to her and she said to do route these two because these are from [facility] not her normal pharmacy [facility]) None of her other medications have [facility]ed through with [facility].

## 2015-07-18 NOTE — Telephone Encounter (Signed)
Patient has an appointment coming up on 12/6- should be OK until then. BP was not in control, so don't want to give her 90 day supply if we have to change it. Please let me know if she's totally out and I'll get her enough to get to her appointment.

## 2015-07-18 NOTE — Telephone Encounter (Signed)
Patient states that she needs a refill for ALL medications sent to [facility]- phone : [phone].  Please call to let patient know when refills have been submitted @ [phone] (pt states okay to leave message on voice mail)

## 2015-07-19 NOTE — Telephone Encounter (Signed)
Patient notified

## 2015-08-02 ENCOUNTER — Ambulatory Visit (INDEPENDENT_AMBULATORY_CARE_PROVIDER_SITE_OTHER): Payer: 59 | Admitting: Family Medicine

## 2015-08-02 ENCOUNTER — Encounter: Payer: Self-pay | Admitting: Family Medicine

## 2015-08-02 VITALS — BP 210/72 | HR 70 | Temp 97.7°F | Ht 64.1 in | Wt 201.0 lb

## 2015-08-02 DIAGNOSIS — I129 Hypertensive chronic kidney disease with stage 1 through stage 4 chronic kidney disease, or unspecified chronic kidney disease: Secondary | ICD-10-CM

## 2015-08-02 DIAGNOSIS — M545 Low back pain, unspecified: Secondary | ICD-10-CM

## 2015-08-02 DIAGNOSIS — Z124 Encounter for screening for malignant neoplasm of cervix: Secondary | ICD-10-CM

## 2015-08-02 DIAGNOSIS — Z1239 Encounter for other screening for malignant neoplasm of breast: Secondary | ICD-10-CM

## 2015-08-02 DIAGNOSIS — R3915 Urgency of urination: Secondary | ICD-10-CM | POA: Diagnosis not present

## 2015-08-02 DIAGNOSIS — N390 Urinary tract infection, site not specified: Secondary | ICD-10-CM

## 2015-08-02 DIAGNOSIS — Z0000 Encounter for general adult medical examination without abnormal findings: Secondary | ICD-10-CM | POA: Diagnosis not present

## 2015-08-02 DIAGNOSIS — R8281 Pyuria: Secondary | ICD-10-CM

## 2015-08-02 LAB — MICROSCOPIC EXAMINATION: RBC, UA: NONE SEEN /hpf (ref 0–?)

## 2015-08-02 LAB — HM PAP SMEAR: HM Pap smear: NEGATIVE

## 2015-08-02 MED ORDER — DICLOFENAC SODIUM 1 % TD GEL: 4.0000 g | Freq: Four times a day (QID) | TRANSDERMAL | Status: DC

## 2015-08-02 MED ORDER — CLONIDINE HCL 0.1 MG PO TABS
0.1000 mg | ORAL_TABLET | Freq: Two times a day (BID) | ORAL | Status: DC
Start: 2015-08-02 — End: 2015-10-01

## 2015-08-02 MED ORDER — METFORMIN HCL 850 MG PO TABS: 850.0000 mg | ORAL_TABLET | Freq: Two times a day (BID) | ORAL | Status: DC

## 2015-08-02 MED ORDER — FLUTICASONE PROPIONATE 50 MCG/ACT NA SUSP: 2.0000 | Freq: Every day | NASAL | Status: DC

## 2015-08-02 MED ORDER — SIMVASTATIN 20 MG PO TABS: 20.0000 mg | ORAL_TABLET | Freq: Every day | ORAL | Status: DC

## 2015-08-02 MED ORDER — LISINOPRIL-HYDROCHLOROTHIAZIDE 20-25 MG PO TABS: 2.0000 | ORAL_TABLET | Freq: Every day | ORAL | Status: DC

## 2015-08-02 MED ORDER — GLIPIZIDE ER 10 MG PO TB24: 10.0000 mg | ORAL_TABLET | Freq: Every day | ORAL | Status: DC

## 2015-08-02 MED ORDER — LIRAGLUTIDE 18 MG/3ML ~~LOC~~ SOPN: 1.8000 mg | PEN_INJECTOR | Freq: Every day | SUBCUTANEOUS | Status: DC

## 2015-08-02 MED ORDER — CYCLOBENZAPRINE HCL 10 MG PO TABS: 10.0000 mg | ORAL_TABLET | Freq: Three times a day (TID) | ORAL | Status: DC | PRN

## 2015-08-02 MED ORDER — BECLOMETHASONE DIPROPIONATE 40 MCG/ACT IN AERS: 2.0000 | INHALATION_SPRAY | Freq: Every day | RESPIRATORY_TRACT | Status: DC | PRN

## 2015-08-02 MED ORDER — INSULIN GLARGINE 300 UNIT/ML ~~LOC~~ SOPN: 25.0000 [IU] | PEN_INJECTOR | Freq: Every day | SUBCUTANEOUS | Status: DC

## 2015-08-02 NOTE — Progress Notes (Signed)
BP 210/72 mmHg  Pulse 70  Temp(Src) 97.7 F (36.5 C)  Ht 5' 4.1" (1.628 m)  Wt 201 lb (91.173 kg)  BMI 34.40 kg/m2  SpO2 98%   Subjective:    Patient ID: April Bauer, female    DOB: 06-20-1956, 59 y.o.   MRN: [id number]  HPI: April Bauer is a 60 y.o. female presenting on 08/02/2015 for comprehensive medical examination. Current medical complaints include:  HYPERTENSION- only took her medication as she was coming into the office today. Didn't take it yesterday Hypertension status: uncontrolled  Satisfied with current treatment? no Duration of hypertension: chronic BP monitoring frequency:  not checking BP medication side effects:  no Medication compliance: fair compliance Aspirin: yes Recurrent headaches: yes Visual changes: yes- has been having floaters Palpitations: no Dyspnea: yes Chest pain: yes Lower extremity edema: no Dizzy/lightheaded: yes  BACK PAIN Duration: chronic Mechanism of injury: unknown Location: bilateral and low back Onset: gradual Severity: severe Quality: dull and aching Frequency: constant Radiation: buttocks Aggravating factors: lifting, movement, walking, laying and bending Alleviating factors: rest, heat, NSAIDs and muscle relaxer Status: worse Treatments attempted: rest, heat, APAP, ibuprofen, aleve, physical therapy and HEP  Relief with NSAIDs?: significant Nighttime pain:  no Paresthesias / decreased sensation:  no Bowel / bladder incontinence:  no Fevers:  no Dysuria / urinary frequency:  no   She currently lives with: husband Menopausal Symptoms: yes- has a lot of problems with hot flashes  Depression Screen done today and results listed below:  Depression screen [facility] 2/9 08/02/2015 08/02/2015  Decreased Interest 0 0  Down, Depressed, Hopeless 0 0  PHQ - 2 Score 0 0    The patient does not have a history of falls. I did not complete a risk assessment for falls. A plan of care for falls was not documented.  Past Medical History:   Past Medical History  Diagnosis Date  . Hyperlipidemia   . Hypertension   . Diabetes mellitus without complication ([place])   . Anxiety     Surgical History:  Past Surgical History  Procedure Laterality Date  . Cholecystectomy    . Rotary cuff    . Tubal ligation      Medications:  Current Outpatient Prescriptions on File Prior to Visit  Medication Sig  . aspirin EC 81 MG tablet Take 81 mg by mouth daily.  . blood glucose meter kit and supplies KIT Dispense based on patient and insurance preference. Use up to four times daily as directed. (FOR ICD-9 250.00, 250.01).  Marland Kitchen glucose blood test strip Use as instructed  . ibuprofen (ADVIL,MOTRIN) 600 MG tablet Take 1 tablet (600 mg total) by mouth every 8 (eight) hours as needed for moderate pain.  Marland Kitchen Olopatadine HCl (PATADAY) 0.2 % SOLN Apply 1 drop to eye daily.  Marland Kitchen triamcinolone cream (KENALOG) 0.1 % Apply 1 application topically daily as needed.   No current facility-administered medications on file prior to visit.    Allergies:  Allergies  Allergen Reactions  . Eggs Or Egg-Derived Products Shortness Of Breath  . Tramadol Nausea Only  . Vicodin [Hydrocodone-Acetaminophen] Other (See Comments)    dizzy    Social History:  Social History   Social History  . Marital Status: Married    Spouse Name: N/A  . Number of Children: N/A  . Years of Education: N/A   Occupational History  . Not on file.   Social History Main Topics  . Smoking status: Never Smoker   . Smokeless tobacco: Never Used  .  Alcohol Use: No  . Drug Use: No  . Sexual Activity: Yes   Other Topics Concern  . Not on file   Social History Narrative   History  Smoking status  . Never Smoker   Smokeless tobacco  . Never Used   History  Alcohol Use No    Family History:  Family History  Problem Relation Age of Onset  . Cancer Mother     Pancreatic  . Diabetes Sister   . Hypertension Sister   . Diabetes Brother   . Hypertension Brother   .  Liver disease Brother     Past medical history, surgical history, medications, allergies, family history and social history reviewed with patient today and changes made to appropriate areas of the chart.   Review of Systems  Constitutional: Negative.   HENT: Positive for congestion. Negative for ear discharge, ear pain, hearing loss, nosebleeds, sore throat and tinnitus.   Eyes: Negative.        Floaters   Respiratory: Positive for shortness of breath. Negative for cough, hemoptysis, sputum production, wheezing and stridor.   Cardiovascular: Positive for chest pain (thinks due to acid reflux). Negative for palpitations, orthopnea, claudication, leg swelling and PND.  Gastrointestinal: Positive for heartburn and diarrhea (Has had 2 episodes since being here, comes every 15 days, lasts for 2 day). Negative for nausea, vomiting, abdominal pain, constipation, blood in stool and melena.  Genitourinary: Positive for urgency. Negative for dysuria, frequency, hematuria and flank pain.       Incomplete emptying  Musculoskeletal: Positive for myalgias and back pain. Negative for joint pain, falls and neck pain.  Skin: Positive for itching (happens int he winter). Negative for rash.  Neurological: Negative.   Endo/Heme/Allergies: Negative.   Psychiatric/Behavioral: Negative.     All other ROS negative except what is listed above and in the HPI.      Objective:    BP 210/72 mmHg  Pulse 70  Temp(Src) 97.7 F (36.5 C)  Ht 5' 4.1" (1.628 m)  Wt 201 lb (91.173 kg)  BMI 34.40 kg/m2  SpO2 98%  Wt Readings from Last 3 Encounters:  08/02/15 201 lb (91.173 kg)  06/06/15 199 lb (90.266 kg)  05/22/15 210 lb (95.255 kg)    Physical Exam  Constitutional: She is oriented to person, place, and time. She appears well-developed and well-nourished. No distress.  HENT:  Head: Normocephalic and atraumatic.  Right Ear: Hearing, tympanic membrane, external ear and ear canal normal.  Left Ear: Hearing,  tympanic membrane, external ear and ear canal normal.  Nose: Nose normal.  Mouth/Throat: Uvula is midline, oropharynx is clear and moist and mucous membranes are normal. No oropharyngeal exudate.  Eyes: Conjunctivae, EOM and lids are normal. Pupils are equal, round, and reactive to light. Right eye exhibits no discharge. Left eye exhibits no discharge. No scleral icterus.  Neck: Normal range of motion. Neck supple. No JVD present. Carotid bruit is not present. No tracheal deviation present. No thyroid mass and no thyromegaly present.  Cardiovascular: Normal rate, regular rhythm, normal heart sounds, intact distal pulses and normal pulses.  Exam reveals no gallop and no friction rub.   No murmur heard. Pulmonary/Chest: Effort normal and breath sounds normal. No stridor. No respiratory distress. She has no decreased breath sounds. She has no wheezes. She has no rhonchi. She has no rales. She exhibits no tenderness. Right breast exhibits no inverted nipple, no mass, no nipple discharge, no skin change and no tenderness. Left breast exhibits no  inverted nipple, no mass, no nipple discharge, no skin change and no tenderness. Breasts are symmetrical.  Abdominal: Soft. Bowel sounds are normal. She exhibits no distension and no mass. There is no tenderness. There is no rebound and no guarding. Hernia confirmed negative in the right inguinal area and confirmed negative in the left inguinal area.  Genitourinary: Vagina normal and uterus normal. No breast swelling, tenderness, discharge or bleeding. No labial fusion. There is no rash, tenderness, lesion or injury on the right labia. There is no rash, tenderness, lesion or injury on the left labia. Uterus is not deviated, not enlarged, not fixed and not tender. Cervix exhibits no motion tenderness, no discharge and no friability. Right adnexum displays no mass, no tenderness and no fullness. Left adnexum displays no mass, no tenderness and no fullness. No erythema,  tenderness or bleeding in the vagina. No foreign body around the vagina. No signs of injury around the vagina. No vaginal discharge found.  Musculoskeletal: Normal range of motion. She exhibits no edema or tenderness.  Lymphadenopathy:    She has no cervical adenopathy.       Right: No inguinal adenopathy present.       Left: No inguinal adenopathy present.  Neurological: She is alert and oriented to person, place, and time. She has normal reflexes. She displays normal reflexes. No cranial nerve deficit. She exhibits normal muscle tone. Coordination normal.  Skin: Skin is warm, dry and intact. No rash noted. She is not diaphoretic. No erythema. No pallor.  Psychiatric: She has a normal mood and affect. Her speech is normal and behavior is normal. Judgment and thought content normal. Cognition and memory are normal.  Nursing note and vitals reviewed.   Results for orders placed or performed in visit on 08/02/15  Microscopic Examination  Result Value Ref Range   WBC, UA 0-5 0 -  5 /hpf   RBC, UA None seen 0 -  2 /hpf   Epithelial Cells (non renal) 0-10 0 - 10 /hpf   Crystals Present (A) N/A   Crystal Type Amorphous Sediment N/A   Mucus, UA Present Not Estab.   Bacteria, UA Few None seen/Few  UA/M w/rflx Culture, Routine  Result Value Ref Range   Specific Gravity, UA 1.020 1.005 - 1.030   pH, UA 7.0 5.0 - 7.5   Color, UA Yellow Yellow   Appearance Ur Cloudy (A) Clear   Leukocytes, UA Trace (A) Negative   Protein, UA Negative Negative/Trace   Glucose, UA Negative Negative   Ketones, UA Negative Negative   RBC, UA Negative Negative   Bilirubin, UA Negative Negative   Urobilinogen, Ur 1.0 0.2 - 1.0 mg/dL   Nitrite, UA Negative Negative   Microscopic Examination See below:    Urinalysis Reflex Comment       Assessment & Plan:   Problem List Items Addressed This Visit      Genitourinary   Benign hypertensive renal disease    Not under good control. Will increase her  lisinopril-HCTZ to 2 daily and recheck in 1 week. Warning signs for which she should go to the ER discussed. Continue clonadine. Rx sent to her pharmacy. Continue to monitor closely.       Other Visit Diagnoses    Routine general medical examination at a health care facility    -  Primary    Refuses vaccines. Mammo ordered. Pap done today. Screening labs checked last visit. Work on diet and exercise. Continue to monitor.  Screening for breast cancer        Mammogram ordered today    Relevant Orders    MM DIGITAL SCREENING BILATERAL    Bilateral low back pain without sciatica        Refill of flexeril and voltaren. Work on stretching. Continue to monitor. Call if not getting better.     Relevant Medications    cyclobenzaprine (FLEXERIL) 10 MG tablet    Urgency of urination        UA showed trace leuks only. Will await culture. Treat as needed.     Relevant Orders    UA/M w/rflx Culture, Routine (Completed)    Cervical cancer screening        Pap done today. Await results.     Relevant Orders    IGP, Aptima HPV, rfx 16/18,45        Follow up plan: Return 1 week, for BP check.   LABORATORY TESTING:  - Pap smear: pap done  IMMUNIZATIONS:   - Tdap: Tetanus vaccination status reviewed: Refused. - Influenza: Refused- egg allergy - Pneumovax: Refused- egg allergy   SCREENING: -Mammogram: Ordered today  - Colonoscopy: Up to date   PATIENT COUNSELING:   Advised to take 1 mg of folate supplement per day if capable of pregnancy.   Sexuality: Discussed sexually transmitted diseases, partner selection, use of condoms, avoidance of unintended pregnancy  and contraceptive alternatives.   Advised to avoid cigarette smoking.  I discussed with the patient that most people either abstain from alcohol or drink within safe limits (<=14/week and <=4 drinks/occasion for males, <=7/weeks and <= 3 drinks/occasion for females) and that the risk for alcohol disorders and other health effects  rises proportionally with the number of drinks per week and how often a drinker exceeds daily limits.  Discussed cessation/primary prevention of drug use and availability of treatment for abuse.   Diet: Encouraged to adjust caloric intake to maintain  or achieve ideal body weight, to reduce intake of dietary saturated fat and total fat, to limit sodium intake by avoiding high sodium foods and not adding table salt, and to maintain adequate dietary potassium and calcium preferably from fresh fruits, vegetables, and low-fat dairy products.    stressed the importance of regular exercise  Injury prevention: Discussed safety belts, safety helmets, smoke detector, smoking near bedding or upholstery.   Dental health: Discussed importance of regular tooth brushing, flossing, and dental visits.    NEXT PREVENTATIVE PHYSICAL DUE IN 1 YEAR. Return 1 week, for BP check.

## 2015-08-02 NOTE — Assessment & Plan Note (Signed)
Not under good control. Will increase her lisinopril-HCTZ to 2 daily and recheck in 1 week. Warning signs for which she should go to the ER discussed. Continue clonadine. Rx sent to her pharmacy. Continue to monitor closely.

## 2015-08-03 ENCOUNTER — Telehealth: Payer: Self-pay

## 2015-08-03 LAB — UA/M W/RFLX CULTURE, ROUTINE

## 2015-08-03 NOTE — Telephone Encounter (Signed)
Diclofenac Sodium Gel 1% is not cover by patients plan. (I've done a couple PA's for the medication, and it was denied)  They sent in a form noting all the covered alternatives on patient's plan.  Diclofenac Sodium Topical Drops 1.5% Diclofenac Sodium Tabs Ibprophen Tabs Meloxicam Tabs.  It says to please choose a different alternative for patient.

## 2015-08-05 NOTE — Telephone Encounter (Signed)
[facility] says that the PA needs to be appealed or change alternate medications.

## 2015-08-06 LAB — IGP, APTIMA HPV, RFX 16/18,45
HPV Aptima: NEGATIVE
PAP Smear Comment: 0

## 2015-08-08 ENCOUNTER — Encounter: Payer: Self-pay | Admitting: Family Medicine

## 2015-08-08 MED ORDER — MELOXICAM 15 MG PO TABS: 15.0000 mg | ORAL_TABLET | Freq: Every day | ORAL | Status: DC

## 2015-08-08 NOTE — Telephone Encounter (Signed)
10-4! Thanks!

## 2015-08-08 NOTE — Telephone Encounter (Signed)
Dr. Laural Benes,  You want me to appeal again or choose a different medication?

## 2015-08-08 NOTE — Telephone Encounter (Signed)
Sent through meloxicam. We'll see how it works, otherwise we'll appeal again if she fails it.

## 2015-08-10 ENCOUNTER — Ambulatory Visit: Payer: 59 | Admitting: Family Medicine

## 2015-08-24 ENCOUNTER — Ambulatory Visit (INDEPENDENT_AMBULATORY_CARE_PROVIDER_SITE_OTHER): Payer: 59 | Admitting: Family Medicine

## 2015-08-24 ENCOUNTER — Encounter: Payer: Self-pay | Admitting: Family Medicine

## 2015-08-24 VITALS — BP 132/62 | HR 62 | Temp 98.7°F | Ht 64.5 in | Wt 202.0 lb

## 2015-08-24 DIAGNOSIS — I129 Hypertensive chronic kidney disease with stage 1 through stage 4 chronic kidney disease, or unspecified chronic kidney disease: Secondary | ICD-10-CM

## 2015-08-24 MED ORDER — LISINOPRIL-HYDROCHLOROTHIAZIDE 20-25 MG PO TABS: 2.0000 | ORAL_TABLET | Freq: Every day | ORAL | Status: DC

## 2015-08-24 NOTE — Assessment & Plan Note (Signed)
BP better on recheck. Checking BMP today, continue current regimen. Continue to monitor. Return for DM visit in March.

## 2015-08-24 NOTE — Progress Notes (Signed)
BP 132/62 mmHg  Pulse 62  Temp(Src) 98.7 F (37.1 C)  Ht 5' 4.5" (1.638 m)  Wt 202 lb (91.627 kg)  BMI 34.15 kg/m2  SpO2 97%   Subjective:    Patient ID: April Bauer, female    DOB: 05-Oct-1955, 59 y.o.   MRN: [id number]  HPI: April Bauer is a 59 y.o. female  Chief Complaint  Patient presents with  . Hypertension   HYPERTENSION Hypertension status: better  Satisfied with current treatment? yes Duration of hypertension: chronic BP monitoring frequency:  not checking BP medication side effects:  no Medication compliance: excellent compliance Aspirin: no Recurrent headaches: yes Visual changes: yes- goes and comes, has seen eye doctor who says she has floaters Palpitations: no Dyspnea: no Chest pain: no Lower extremity edema: yes, yesterday, but only after being on her feet for a long time Dizzy/lightheaded: no  Relevant past medical, surgical, family and social history reviewed and updated as indicated. Interim medical history since our last visit reviewed. Allergies and medications reviewed and updated.  Review of Systems  Constitutional: Negative.   Respiratory: Negative.   Cardiovascular: Negative.   Musculoskeletal: Negative.   Psychiatric/Behavioral: Negative.     Per HPI unless specifically indicated above     Objective:    BP 132/62 mmHg  Pulse 62  Temp(Src) 98.7 F (37.1 C)  Ht 5' 4.5" (1.638 m)  Wt 202 lb (91.627 kg)  BMI 34.15 kg/m2  SpO2 97%  Wt Readings from Last 3 Encounters:  08/24/15 202 lb (91.627 kg)  08/02/15 201 lb (91.173 kg)  06/06/15 199 lb (90.266 kg)    Physical Exam  Constitutional: She is oriented to person, place, and time. She appears well-developed and well-nourished. No distress.  HENT:  Head: Normocephalic and atraumatic.  Right Ear: Hearing normal.  Left Ear: Hearing normal.  Nose: Nose normal.  Eyes: Conjunctivae and lids are normal. Right eye exhibits no discharge. Left eye exhibits no discharge. No scleral icterus.   Cardiovascular: Normal rate, regular rhythm, normal heart sounds and intact distal pulses.  Exam reveals no gallop and no friction rub.   No murmur heard. Pulmonary/Chest: Effort normal and breath sounds normal. No respiratory distress. She has no wheezes. She has no rales. She exhibits no tenderness.  Musculoskeletal: Normal range of motion.  Neurological: She is alert and oriented to person, place, and time.  Skin: Skin is warm, dry and intact. No rash noted. No erythema. No pallor.  Psychiatric: She has a normal mood and affect. Her speech is normal and behavior is normal. Judgment and thought content normal. Cognition and memory are normal.    Results for orders placed or performed in visit on 08/08/15  HM PAP SMEAR  Result Value Ref Range   HM Pap smear Negative, negative HPV       Assessment & Plan:   Problem List Items Addressed This Visit      Genitourinary   Benign hypertensive renal disease - Primary    BP better on recheck. Checking BMP today, continue current regimen. Continue to monitor. Return for DM visit in March.       Relevant Orders   Basic metabolic panel       Follow up plan: Return March, for DM visit.

## 2015-08-25 LAB — BASIC METABOLIC PANEL
BUN/Creatinine Ratio: 15 (ref 9–23)
BUN: 14 mg/dL (ref 6–24)
CO2: 24 mmol/L (ref 18–29)
Calcium: 9.3 mg/dL (ref 8.7–10.2)
Chloride: 100 mmol/L (ref 96–106)
Creatinine, Ser: 0.93 mg/dL (ref 0.57–1.00)
GFR calc Af Amer: 78 mL/min/{1.73_m2} (ref >59)
GFR calc non Af Amer: 68 mL/min/{1.73_m2} (ref >59)
Glucose: 187 mg/dL — ABNORMAL HIGH (ref 65–99)
Potassium: 3.8 mmol/L (ref 3.5–5.2)
Sodium: 141 mmol/L (ref 134–144)

## 2015-08-26 ENCOUNTER — Encounter: Payer: Self-pay | Admitting: Family Medicine

## 2015-09-07 ENCOUNTER — Other Ambulatory Visit: Payer: Self-pay | Admitting: Family Medicine

## 2015-09-07 ENCOUNTER — Telehealth: Payer: Self-pay | Admitting: Family Medicine

## 2015-09-07 MED ORDER — DULAGLUTIDE 0.75 MG/0.5ML ~~LOC~~ SOAJ: 0.7500 mg | SUBCUTANEOUS | Status: DC

## 2015-09-07 NOTE — Telephone Encounter (Signed)
Please let April Bauer know that her insurance won't cover the victoza any more, so I called in something new. She should finish her victoza, but then it's a 1x a week medicine rather than a 1x a day medicine.

## 2015-09-09 NOTE — Telephone Encounter (Signed)
I have tried to call the patient several times with no answer, left a message with patients husband to have her give [place] a call. Will try one more time.

## 2015-09-09 NOTE — Telephone Encounter (Signed)
Informed patient that medication was switched.

## 2015-09-21 ENCOUNTER — Ambulatory Visit: Admission: RE | Admit: 2015-09-21 | Payer: 59 | Source: Ambulatory Visit

## 2015-09-30 ENCOUNTER — Emergency Department: Payer: 59

## 2015-09-30 ENCOUNTER — Observation Stay
Admission: EM | Admit: 2015-09-30 | Discharge: 2015-10-01 | Disposition: A | Discharge status: Home / Self Care | Payer: 59 | Attending: Internal Medicine | Admitting: Internal Medicine

## 2015-09-30 DIAGNOSIS — Z7982 Long term (current) use of aspirin: Secondary | ICD-10-CM | POA: Diagnosis not present

## 2015-09-30 DIAGNOSIS — Z8249 Family history of ischemic heart disease and other diseases of the circulatory system: Secondary | ICD-10-CM | POA: Diagnosis not present

## 2015-09-30 DIAGNOSIS — E785 Hyperlipidemia, unspecified: Secondary | ICD-10-CM | POA: Insufficient documentation

## 2015-09-30 DIAGNOSIS — E876 Hypokalemia: Secondary | ICD-10-CM | POA: Insufficient documentation

## 2015-09-30 DIAGNOSIS — Z91012 Allergy to eggs: Secondary | ICD-10-CM | POA: Diagnosis not present

## 2015-09-30 DIAGNOSIS — R61 Generalized hyperhidrosis: Secondary | ICD-10-CM | POA: Diagnosis not present

## 2015-09-30 DIAGNOSIS — R0602 Shortness of breath: Secondary | ICD-10-CM | POA: Diagnosis not present

## 2015-09-30 DIAGNOSIS — M069 Rheumatoid arthritis, unspecified: Secondary | ICD-10-CM | POA: Insufficient documentation

## 2015-09-30 DIAGNOSIS — J45909 Unspecified asthma, uncomplicated: Secondary | ICD-10-CM | POA: Insufficient documentation

## 2015-09-30 DIAGNOSIS — I10 Essential (primary) hypertension: Secondary | ICD-10-CM | POA: Diagnosis present

## 2015-09-30 DIAGNOSIS — R079 Chest pain, unspecified: Secondary | ICD-10-CM | POA: Diagnosis present

## 2015-09-30 DIAGNOSIS — Z794 Long term (current) use of insulin: Secondary | ICD-10-CM | POA: Diagnosis not present

## 2015-09-30 DIAGNOSIS — Z8379 Family history of other diseases of the digestive system: Secondary | ICD-10-CM | POA: Insufficient documentation

## 2015-09-30 DIAGNOSIS — Z885 Allergy status to narcotic agent status: Secondary | ICD-10-CM | POA: Diagnosis not present

## 2015-09-30 DIAGNOSIS — Z800 Family history of malignant neoplasm of digestive organs: Secondary | ICD-10-CM | POA: Insufficient documentation

## 2015-09-30 DIAGNOSIS — R0789 Other chest pain: Principal | ICD-10-CM | POA: Insufficient documentation

## 2015-09-30 DIAGNOSIS — Z888 Allergy status to other drugs, medicaments and biological substances status: Secondary | ICD-10-CM | POA: Insufficient documentation

## 2015-09-30 DIAGNOSIS — I160 Hypertensive urgency: Secondary | ICD-10-CM

## 2015-09-30 DIAGNOSIS — E119 Type 2 diabetes mellitus without complications: Secondary | ICD-10-CM | POA: Diagnosis not present

## 2015-09-30 DIAGNOSIS — Z833 Family history of diabetes mellitus: Secondary | ICD-10-CM | POA: Diagnosis not present

## 2015-09-30 DIAGNOSIS — I517 Cardiomegaly: Secondary | ICD-10-CM | POA: Diagnosis not present

## 2015-09-30 DIAGNOSIS — F419 Anxiety disorder, unspecified: Secondary | ICD-10-CM | POA: Diagnosis present

## 2015-09-30 DIAGNOSIS — Z9049 Acquired absence of other specified parts of digestive tract: Secondary | ICD-10-CM | POA: Insufficient documentation

## 2015-09-30 HISTORY — DX: Rheumatoid arthritis, unspecified: M06.9

## 2015-09-30 HISTORY — DX: Unspecified asthma, uncomplicated: J45.909

## 2015-09-30 LAB — LIPASE, BLOOD: Lipase: 40 U/L (ref 11–51)

## 2015-09-30 LAB — COMPREHENSIVE METABOLIC PANEL
ALT: 24 U/L (ref 14–54)
AST: 27 U/L (ref 15–41)
Albumin: 3.9 g/dL (ref 3.5–5.0)
Alkaline Phosphatase: 94 U/L (ref 38–126)
Anion gap: 14 (ref 5–15)
BUN: 17 mg/dL (ref 6–20)
CO2: 26 mmol/L (ref 22–32)
Calcium: 9.4 mg/dL (ref 8.9–10.3)
Chloride: 100 mmol/L — ABNORMAL LOW (ref 101–111)
Creatinine, Ser: 0.95 mg/dL (ref 0.44–1.00)
GFR calc Af Amer: >60 mL/min (ref >60)
GFR calc non Af Amer: >60 mL/min (ref >60)
Glucose, Bld: 237 mg/dL — ABNORMAL HIGH (ref 65–99)
Potassium: 3.1 mmol/L — ABNORMAL LOW (ref 3.5–5.1)
Sodium: 140 mmol/L (ref 135–145)
Total Bilirubin: <0.1 mg/dL — ABNORMAL LOW (ref 0.3–1.2)
Total Protein: 8 g/dL (ref 6.5–8.1)

## 2015-09-30 LAB — CBC
HCT: 36.9 % (ref 35.0–47.0)
Hemoglobin: 11.8 g/dL — ABNORMAL LOW (ref 12.0–16.0)
MCH: 25 pg — ABNORMAL LOW (ref 26.0–34.0)
MCHC: 32 g/dL (ref 32.0–36.0)
MCV: 78.2 fL — ABNORMAL LOW (ref 80.0–100.0)
Platelets: 202 10*3/uL (ref 150–440)
RBC: 4.72 MIL/uL (ref 3.80–5.20)
RDW: 13.5 % (ref 11.5–14.5)
WBC: 6.4 10*3/uL (ref 3.6–11.0)

## 2015-09-30 LAB — TROPONIN I
Troponin I: <0.03 ng/mL (ref <0.031)
Troponin I: <0.03 ng/mL (ref <0.031)

## 2015-09-30 MED ORDER — NITROGLYCERIN 0.4 MG SL SUBL
0.4000 mg | SUBLINGUAL_TABLET | SUBLINGUAL | Status: DC | PRN
  Administered 2015-09-30 (×2): 0.4 mg via SUBLINGUAL
  Filled 2015-09-30: qty 1

## 2015-09-30 MED ORDER — ACETAMINOPHEN 500 MG PO TABS
1000.0000 mg | ORAL_TABLET | Freq: Once | ORAL | Status: AC
  Administered 2015-09-30: 1000 mg via ORAL
  Filled 2015-09-30: qty 2

## 2015-09-30 MED ORDER — NITROGLYCERIN 0.4 MG SL SUBL: 0.4000 mg | SUBLINGUAL_TABLET | SUBLINGUAL | Status: DC | PRN

## 2015-09-30 MED ORDER — CLONIDINE HCL 0.1 MG PO TABS
0.1000 mg | ORAL_TABLET | ORAL | Status: AC
  Administered 2015-09-30: 0.1 mg via ORAL
  Filled 2015-09-30: qty 1

## 2015-09-30 MED ORDER — MORPHINE SULFATE (PF) 4 MG/ML IV SOLN
4.0000 mg | Freq: Once | INTRAVENOUS | Status: AC
  Administered 2015-09-30: 4 mg via INTRAVENOUS
  Filled 2015-09-30: qty 1

## 2015-09-30 MED ORDER — ACETAMINOPHEN 325 MG PO TABS: 650.0000 mg | ORAL_TABLET | Freq: Once | ORAL | Status: DC

## 2015-09-30 MED ORDER — NITROGLYCERIN 0.4 MG SL SUBL
0.4000 mg | SUBLINGUAL_TABLET | SUBLINGUAL | Status: DC | PRN
  Filled 2015-09-30: qty 1

## 2015-09-30 NOTE — ED Notes (Signed)
Pt reports no current chest pain. Pt reports 5 out of 10 headache

## 2015-09-30 NOTE — ED Provider Notes (Signed)
[facility] Emergency Department Provider Note  ____________________________________________  Time seen: Approximately 8:18 PM  I have reviewed the triage vital signs and the nursing notes.   HISTORY  Chief Complaint Chest Pain    HPI April Bauer is a 60 y.o. female since for evaluation of severe mid chest pain and pressure. She reports she was at work, she walked outside for a breath of fresh air and began experiencing a burning sensation in the chest which suddenly became as severe chest pressure. No nausea or vomiting. No radiation. Pain is relieved to about one half with taking nitroglycerin. She is taking a total of 325+ one baby aspirin and 2 nitroglycerin prior to arrival with some relief. She course she had a similar episode when was hospitalized about 3 years ago where she had a catheterization but does not know the result but does not have any stent or surgery.  She does occasionally smoke marijuana.  She is a history of diabetes and high blood pressure.  No recent surgeries, leg swelling, long travels. No use of estrogen. No history of blood clots. Denies sharp chest pain or trouble breathing.    Past Medical History  Diagnosis Date  . Hyperlipidemia   . Hypertension   . Diabetes mellitus without complication (HCC)   . Anxiety   . Asthma   . RA (rheumatoid arthritis) [facility])     Patient Active Problem List   Diagnosis Date Noted  . Chest pain 09/30/2015  . Hyperlipemia   . HTN (hypertension)   . Type 2 diabetes mellitus (HCC)   . Anxiety     Past Surgical History  Procedure Laterality Date  . Cholecystectomy    . Rotary cuff    . Tubal ligation    . Cardiac catherization      Current Outpatient Rx  Name  Route  Sig  Dispense  Refill  . aspirin EC 81 MG tablet   Oral   Take 81 mg by mouth daily.         . beclomethasone (QVAR) 40 MCG/ACT inhaler   Inhalation   Inhale 2 puffs into the lungs 2 (two) times daily as needed (for  shortness of breath/wheezing).         . cloNIDine (CATAPRES) 0.1 MG tablet   Oral   Take 1 tablet (0.1 mg total) by mouth 2 (two) times daily.   180 tablet   1   . cyclobenzaprine (FLEXERIL) 10 MG tablet   Oral   Take 1 tablet (10 mg total) by mouth 3 (three) times daily as needed for muscle spasms.   90 tablet   0   . fluticasone (FLONASE) 50 MCG/ACT nasal spray   Each Nare   Place 2 sprays into both nostrils daily. Patient taking differently: Place 2 sprays into both nostrils daily as needed for rhinitis.    16 g   6   . glipiZIDE (GLUCOTROL XL) 10 MG 24 hr tablet   Oral   Take 1 tablet (10 mg total) by mouth daily with breakfast.   90 tablet   1   . Insulin Glargine (TOUJEO SOLOSTAR) 300 UNIT/ML SOPN   Subcutaneous   Inject 25 Units into the skin daily.   3 pen   3   . lisinopril-hydrochlorothiazide (PRINZIDE,ZESTORETIC) 20-25 MG tablet   Oral   Take 2 tablets by mouth daily.   60 tablet   6   . meloxicam (MOBIC) 15 MG tablet   Oral   Take  1 tablet (15 mg total) by mouth daily.   30 tablet   3   . metFORMIN (GLUCOPHAGE) 850 MG tablet   Oral   Take 1 tablet (850 mg total) by mouth 2 (two) times daily with a meal.   180 tablet   1   . Olopatadine HCl (PATADAY) 0.2 % SOLN   Both Eyes   Place 1 drop into both eyes as needed (for allergies).          . simvastatin (ZOCOR) 20 MG tablet   Oral   Take 1 tablet (20 mg total) by mouth daily.   90 tablet   1   . triamcinolone cream (KENALOG) 0.1 %   Topical   Apply 1 application topically as needed (for itching).          . Dulaglutide (TRULICITY) 0.75 MG/0.5ML SOPN   Subcutaneous   Inject 0.75 mg into the skin once a week.   4 pen   3   . ibuprofen (ADVIL,MOTRIN) 600 MG tablet   Oral   Take 1 tablet (600 mg total) by mouth every 8 (eight) hours as needed for moderate pain. Patient not taking: Reported on 09/30/2015   21 tablet   0     Allergies Eggs or egg-derived products; Tramadol; and  Vicodin  Family History  Problem Relation Age of Onset  . Cancer Mother     Pancreatic  . Diabetes Sister   . Hypertension Sister   . Diabetes Brother   . Hypertension Brother   . Liver disease Brother     Social History Social History  Substance Use Topics  . Smoking status: Never Smoker   . Smokeless tobacco: Never Used  . Alcohol Use: 1.8 oz/week    3 Shots of liquor per week    Review of Systems Constitutional: No fever/chills Eyes: No visual changes. ENT: No sore throat. Cardiovascular: See history of present illness Respiratory: Denies shortness of breath. Gastrointestinal: No abdominal pain.  No nausea, no vomiting.  No diarrhea.  No constipation. Genitourinary: Negative for dysuria. Musculoskeletal: Negative for back pain. Skin: Negative for rash. Neurological: Negative for headaches, focal weakness or numbness.  10-point ROS otherwise negative.  ____________________________________________   PHYSICAL EXAM:  VITAL SIGNS: ED Triage Vitals  Enc Vitals Group     BP 09/30/15 2001 186/77 mmHg     Pulse Rate 09/30/15 2001 76     Resp 09/30/15 2001 16     Temp 09/30/15 2001 98.4 F (36.9 C)     Temp Source 09/30/15 2001 Oral     SpO2 09/30/15 2001 97 %     Weight 09/30/15 2001 200 lb (90.719 kg)     Height 09/30/15 2001 5' 5.5" (1.664 m)     Head Cir --      Peak Flow --      Pain Score 09/30/15 1955 10     Pain Loc --      Pain Edu? --      Excl. in GC? --    Constitutional: Alert and oriented. Slightly anxious. Sitting upright but in no distress Eyes: Conjunctivae are normal. PERRL. EOMI. Head: Atraumatic. Nose: No congestion/rhinnorhea. Mouth/Throat: Mucous membranes are moist.  Oropharynx non-erythematous. Neck: No stridor.   Cardiovascular: Normal rate, regular rhythm. Grossly normal heart sounds.  Good peripheral circulation. Respiratory: Normal respiratory effort.  No retractions. Lungs CTAB. Gastrointestinal: Soft and nontender. No  distention.  Musculoskeletal: No lower extremity tenderness nor edema.  No joint effusions. Neurologic:  Normal speech and language. No gross focal neurologic deficits are appreciated.  Skin:  Skin is warm, dry and intact. No rash noted. Psychiatric: Mood and affect are normal. Speech and behavior are normal.  ____________________________________________   LABS (all labs ordered are listed, but only abnormal results are displayed)  Labs Reviewed  CBC - Abnormal; Notable for the following:    Hemoglobin 11.8 (*)    MCV 78.2 (*)    MCH 25.0 (*)    All other components within normal limits  COMPREHENSIVE METABOLIC PANEL - Abnormal; Notable for the following:    Potassium 3.1 (*)    Chloride 100 (*)    Glucose, Bld 237 (*)    Total Bilirubin <0.1 (*)    All other components within normal limits  LIPASE, BLOOD  TROPONIN I  TROPONIN I   ____________________________________________  EKG Reviewed and interpreted by me EKG time 2000 Normal sinus rhythm Left ventricular hypertrophy by voltage Nonspecific T-wave abnormality seen in multiple leads including lateral and inferior. No acute ST elevation  Repeat EKG performed at 2022 reviewed and interpreted by me Normal sinus rhythm Heart rate 70 QRS 100 QTc 4:30 Nonspecific T-wave again seen, no significant change from previous. ____________________________________________  RADIOLOGY     DG Chest Port 1 View (Final result) Result time: 09/30/15 20:50:56   Final result by Rad Results In Interface (09/30/15 20:50:56)   Narrative:   CLINICAL DATA: Intermittent chest pain. Shortness of breath.  EXAM: PORTABLE CHEST 1 VIEW  COMPARISON: None.  FINDINGS: Monitoring leads overlie the patient. Enlarged cardiac and mediastinal contours. No consolidative pulmonary opacities. No pleural effusion or pneumothorax. Absence of the distal right clavicle, potentially posttraumatic in etiology.  IMPRESSION: Cardiomegaly.  No  acute cardiopulmonary process.   Electronically Signed By: Annia Belt M.D. On: 09/30/2015 20:50    ____________________________________________   PROCEDURES   Procedure(s) performed: None  Critical Care performed: No  ____________________________________________   INITIAL IMPRESSION / ASSESSMENT AND PLAN / ED COURSE  Pertinent labs & imaging results that were available during my care of the patient were reviewed by me and considered in my medical decision making (see chart for details).  Chest pain. Initial EKGs do not demonstrate acute ST elevation MI. Patient does have moderate risk factors by heart score, she also has heavy chest pressure relieved by nitroglycerin. The patient has associated significant hypertension. We'll treat her with clonidine, which she takes at home. Initial troponin is normal, but based on risk factors and current presentation I'm concerned about the possibility of hypertensive urgency versus acute coronary syndrome. Patient has been given adequate aspirin. No signs or symptoms suggest acute pulmonary embolism or dissection. Being a hospital service for ongoing care and management. ____________________________________________   FINAL CLINICAL IMPRESSION(S) / ED DIAGNOSES  Final diagnoses:  Moderate risk chest pain  Hypertensive urgency      Sharyn Creamer, MD 09/30/15 2356

## 2015-09-30 NOTE — ED Notes (Addendum)
Pt c/o of intermittant central chest pain, SOB, diaphoresis, nausea, and back pain.   Per EMS: Pt has hx of cardiac cath. Pt c/o of substernal midline chest pain. Pt in NSR, BP 220/110. Pt given Nitrostat spray, sublingal - systolic BP dedcreased to 190. Pt given 2" nitro paste and 324 aspirin - BP 213/100. CBG 205

## 2015-09-30 NOTE — ED Notes (Signed)
MD Quale at bedside. 

## 2015-09-30 NOTE — H&P (Signed)
[facility] [place] [facility]   PATIENT NAME: April Bauer    MR#:  [id number]  DATE OF BIRTH:  12/08/1955  DATE OF ADMISSION:  09/30/2015  PRIMARY CARE PHYSICIAN: Olevia Perches, DO   REQUESTING/REFERRING PHYSICIAN: Fanny Bien, MD  CHIEF COMPLAINT:   Chief Complaint  Patient presents with  . Chest Pain    HISTORY OF PRESENT ILLNESS:  April Bauer  is a 60 y.o. female who presents with chest pain. Patient states that she was out and about during her normal activities when she developed sudden onset substernal burning sensation. Patient states she did have some associated diaphoresis and nausea. She denies any vomiting, radiation, vision change or headache. She had chest pain like this in the past, and had a catheterization done, but she does not recall the outcome results of the catheterization. However she does state that she is fairly certain that he did not put a stent in her coronary arteries because she was not on any different medications after that catheterization for any length of time. On evaluation in the ED tonight her troponin was within normal limits. Her pain did ease off and resolve with initiation of nitroglycerin. Hospitalists were called for further evaluation and admission.  PAST MEDICAL HISTORY:   Past Medical History  Diagnosis Date  . Hyperlipidemia   . Hypertension   . Diabetes mellitus without complication (HCC)   . Anxiety   . Asthma   . RA (rheumatoid arthritis) (HCC)     PAST SURGICAL HISTORY:   Past Surgical History  Procedure Laterality Date  . Cholecystectomy    . Rotary cuff    . Tubal ligation    . Cardiac catherization      SOCIAL HISTORY:   Social History  Substance Use Topics  . Smoking status: Never Smoker   . Smokeless tobacco: Never Used  . Alcohol Use: 1.8 oz/week    3 Shots of liquor per week    FAMILY HISTORY:   Family History  Problem Relation Age of Onset  . Cancer Mother     Pancreatic  .  Diabetes Sister   . Hypertension Sister   . Diabetes Brother   . Hypertension Brother   . Liver disease Brother     DRUG ALLERGIES:   Allergies  Allergen Reactions  . Eggs Or Egg-Derived Products Shortness Of Breath  . Tramadol Nausea And Vomiting  . Vicodin [Hydrocodone-Acetaminophen] Other (See Comments)    Reaction:  Dizziness     MEDICATIONS AT HOME:   Prior to Admission medications   Medication Sig Start Date End Date Taking? Authorizing Provider  aspirin EC 81 MG tablet Take 81 mg by mouth daily.   Yes Historical Provider, MD  beclomethasone (QVAR) 40 MCG/ACT inhaler Inhale 2 puffs into the lungs 2 (two) times daily as needed (for shortness of breath/wheezing).   Yes Historical Provider, MD  cloNIDine (CATAPRES) 0.1 MG tablet Take 1 tablet (0.1 mg total) by mouth 2 (two) times daily. 08/02/15  Yes Megan P Johnson, DO  cyclobenzaprine (FLEXERIL) 10 MG tablet Take 1 tablet (10 mg total) by mouth 3 (three) times daily as needed for muscle spasms. 08/02/15  Yes Megan P Johnson, DO  fluticasone (FLONASE) 50 MCG/ACT nasal spray Place 2 sprays into both nostrils daily. Patient taking differently: Place 2 sprays into both nostrils daily as needed for rhinitis.  08/02/15  Yes Megan P Johnson, DO  glipiZIDE (GLUCOTROL XL) 10 MG 24 hr tablet Take 1 tablet (10 mg  total) by mouth daily with breakfast. 08/02/15  Yes Megan P Johnson, DO  Insulin Glargine (TOUJEO SOLOSTAR) 300 UNIT/ML SOPN Inject 25 Units into the skin daily. 08/02/15  Yes Megan P Johnson, DO  lisinopril-hydrochlorothiazide (PRINZIDE,ZESTORETIC) 20-25 MG tablet Take 2 tablets by mouth daily. 08/24/15  Yes Megan P Johnson, DO  meloxicam (MOBIC) 15 MG tablet Take 1 tablet (15 mg total) by mouth daily. 08/08/15  Yes Megan P Johnson, DO  metFORMIN (GLUCOPHAGE) 850 MG tablet Take 1 tablet (850 mg total) by mouth 2 (two) times daily with a meal. 08/02/15  Yes Megan P Johnson, DO  Olopatadine HCl (PATADAY) 0.2 % SOLN Place 1 drop into both  eyes as needed (for allergies).    Yes Historical Provider, MD  simvastatin (ZOCOR) 20 MG tablet Take 1 tablet (20 mg total) by mouth daily. 08/02/15  Yes Megan P Johnson, DO  triamcinolone cream (KENALOG) 0.1 % Apply 1 application topically as needed (for itching).    Yes Historical Provider, MD  Dulaglutide (TRULICITY) 0.75 MG/0.5ML SOPN Inject 0.75 mg into the skin once a week. 09/07/15   Megan P Johnson, DO  ibuprofen (ADVIL,MOTRIN) 600 MG tablet Take 1 tablet (600 mg total) by mouth every 8 (eight) hours as needed for moderate pain. Patient not taking: Reported on 09/30/2015 04/23/15   Tommi Rumps, PA-C    REVIEW OF SYSTEMS:  Review of Systems  Constitutional: Negative for fever, chills, weight loss and malaise/fatigue.  HENT: Negative for ear pain, hearing loss and tinnitus.   Eyes: Negative for blurred vision, double vision, pain and redness.  Respiratory: Positive for shortness of breath. Negative for cough and hemoptysis.   Cardiovascular: Positive for chest pain. Negative for palpitations, orthopnea and leg swelling.  Gastrointestinal: Positive for nausea. Negative for vomiting, abdominal pain, diarrhea and constipation.  Genitourinary: Negative for dysuria, frequency and hematuria.  Musculoskeletal: Negative for back pain, joint pain and neck pain.  Skin:       No acne, rash, or lesions  Neurological: Negative for dizziness, tremors, focal weakness and weakness.  Endo/Heme/Allergies: Negative for polydipsia. Does not bruise/bleed easily.  Psychiatric/Behavioral: Negative for depression. The patient is not nervous/anxious and does not have insomnia.      VITAL SIGNS:   Filed Vitals:   09/30/15 2231 09/30/15 2235 09/30/15 2256 09/30/15 2321  BP: 185/77 197/86 190/89 195/81  Pulse:   63   Temp:      TempSrc:      Resp:  20 17   Height:      Weight:      SpO2:   97%    Wt Readings from Last 3 Encounters:  09/30/15 90.719 kg (200 lb)  08/24/15 91.627 kg (202 lb)   08/02/15 91.173 kg (201 lb)    PHYSICAL EXAMINATION:  Physical Exam  Vitals reviewed. Constitutional: She is oriented to person, place, and time. She appears well-developed and well-nourished. No distress.  HENT:  Head: Normocephalic and atraumatic.  Mouth/Throat: Oropharynx is clear and moist.  Eyes: Conjunctivae and EOM are normal. Pupils are equal, round, and reactive to light. No scleral icterus.  Neck: Normal range of motion. Neck supple. No JVD present. No thyromegaly present.  Cardiovascular: Normal rate, regular rhythm and intact distal pulses.  Exam reveals no gallop and no friction rub.   No murmur heard. Respiratory: Effort normal and breath sounds normal. No respiratory distress. She has no wheezes. She has no rales.  GI: Soft. Bowel sounds are normal. She exhibits no distension. There  is no tenderness.  Musculoskeletal: Normal range of motion. She exhibits no edema.  No arthritis, no gout  Lymphadenopathy:    She has no cervical adenopathy.  Neurological: She is alert and oriented to person, place, and time. No cranial nerve deficit.  No dysarthria, no aphasia  Skin: Skin is warm and dry. No rash noted. No erythema.  Psychiatric: She has a normal mood and affect. Her behavior is normal. Judgment and thought content normal.    LABORATORY PANEL:   CBC  Recent Labs Lab 09/30/15 2018  WBC 6.4  HGB 11.8*  HCT 36.9  PLT 202   ------------------------------------------------------------------------------------------------------------------  Chemistries   Recent Labs Lab 09/30/15 2018  NA 140  K 3.1*  CL 100*  CO2 26  GLUCOSE 237*  BUN 17  CREATININE 0.95  CALCIUM 9.4  AST 27  ALT 24  ALKPHOS 94  BILITOT <0.1*   ------------------------------------------------------------------------------------------------------------------  Cardiac Enzymes  Recent Labs Lab 09/30/15 2320  TROPONINI <0.03    ------------------------------------------------------------------------------------------------------------------  RADIOLOGY:  Dg Chest Port 1 View  09/30/2015  CLINICAL DATA:  Intermittent chest pain.  Shortness of breath. EXAM: PORTABLE CHEST 1 VIEW COMPARISON:  None. FINDINGS: Monitoring leads overlie the patient. Enlarged cardiac and mediastinal contours. No consolidative pulmonary opacities. No pleural effusion or pneumothorax. Absence of the distal right clavicle, potentially posttraumatic in etiology. IMPRESSION: Cardiomegaly. No acute cardiopulmonary process. Electronically Signed   By: Annia Belt M.D.   On: 09/30/2015 20:50    EKG:   Orders placed or performed during the hospital encounter of 09/30/15  . EKG 12-Lead  . EKG 12-Lead  . ED EKG  . ED EKG  . ED EKG  . ED EKG    IMPRESSION AND PLAN:  Principal Problem:   Chest pain - cardiac enzymes negative so far. We will trend them serially tonight. We'll keep her on telemetry. Order an echocardiogram and cardiology consult the morning. Active Problems:   Hyperlipemia - continue home meds   HTN (hypertension) - continue home meds as well as additional when necessary antihypertensives for blood pressure goal less than 160/100   Type 2 diabetes mellitus (HCC) - sliding scale insulin with corresponding glucose checks every 6 hours while nothing by mouth for possible cardiac procedure tomorrow   Anxiety - continue home anxiolytics  All the records are reviewed and case discussed with ED provider. Management plans discussed with the patient and/or family.  DVT PROPHYLAXIS: SubQ lovenox  GI PROPHYLAXIS: None  ADMISSION STATUS: Observation  CODE STATUS: Full Code Status History    This patient does not have a recorded code status. Please follow your organizational policy for patients in this situation.      TOTAL TIME TAKING CARE OF THIS PATIENT: 45 minutes.    Loreli Debruler FIELDING 09/30/2015, 11:55 PM  [facility]  Office  [phone]  CC: Primary care physician; Olevia Perches, DO

## 2015-09-30 NOTE — ED Notes (Signed)
Pt reports headache has resolved

## 2015-10-01 ENCOUNTER — Encounter: Payer: Self-pay | Admitting: Radiology

## 2015-10-01 ENCOUNTER — Observation Stay (HOSPITAL_BASED_OUTPATIENT_CLINIC_OR_DEPARTMENT_OTHER): Payer: 59

## 2015-10-01 DIAGNOSIS — R079 Chest pain, unspecified: Secondary | ICD-10-CM

## 2015-10-01 LAB — POTASSIUM: Potassium: 4.3 mmol/L (ref 3.5–5.1)

## 2015-10-01 LAB — BASIC METABOLIC PANEL
Anion gap: 7 (ref 5–15)
BUN: 18 mg/dL (ref 6–20)
CO2: 31 mmol/L (ref 22–32)
Calcium: 9.1 mg/dL (ref 8.9–10.3)
Chloride: 100 mmol/L — ABNORMAL LOW (ref 101–111)
Creatinine, Ser: 0.75 mg/dL (ref 0.44–1.00)
GFR calc Af Amer: >60 mL/min (ref >60)
GFR calc non Af Amer: >60 mL/min (ref >60)
Glucose, Bld: 163 mg/dL — ABNORMAL HIGH (ref 65–99)
Potassium: 2.8 mmol/L — CL (ref 3.5–5.1)
Sodium: 138 mmol/L (ref 135–145)

## 2015-10-01 LAB — CBC
HCT: 33.9 % — ABNORMAL LOW (ref 35.0–47.0)
Hemoglobin: 11.2 g/dL — ABNORMAL LOW (ref 12.0–16.0)
MCH: 25.5 pg — ABNORMAL LOW (ref 26.0–34.0)
MCHC: 32.9 g/dL (ref 32.0–36.0)
MCV: 77.5 fL — ABNORMAL LOW (ref 80.0–100.0)
Platelets: 201 10*3/uL (ref 150–440)
RBC: 4.38 MIL/uL (ref 3.80–5.20)
RDW: 13.5 % (ref 11.5–14.5)
WBC: 6.4 10*3/uL (ref 3.6–11.0)

## 2015-10-01 LAB — GLUCOSE, CAPILLARY
Glucose-Capillary: 160 mg/dL — ABNORMAL HIGH (ref 65–99)
Glucose-Capillary: 153 mg/dL — ABNORMAL HIGH (ref 65–99)

## 2015-10-01 LAB — TROPONIN I
Troponin I: 0.03 ng/mL (ref <0.031)
Troponin I: <0.03 ng/mL (ref <0.031)
Troponin I: <0.03 ng/mL (ref <0.031)

## 2015-10-01 LAB — NM MYOCAR MULTI W/SPECT W/WALL MOTION / EF
Estimated workload: 1 METS
Exercise duration (min): 1 min
Exercise duration (sec): 17 s
LV dias vol: 148 mL
LV sys vol: 90 mL
Peak HR: 90 {beats}/min
Rest HR: 61 {beats}/min
SDS: 1
SRS: 11
SSS: 2
TID: 1.25

## 2015-10-01 LAB — CREATININE, SERUM
Creatinine, Ser: 0.7 mg/dL (ref 0.44–1.00)
GFR calc Af Amer: >60 mL/min (ref >60)
GFR calc non Af Amer: >60 mL/min (ref >60)

## 2015-10-01 MED ORDER — ACETAMINOPHEN 650 MG RE SUPP: 650.0000 mg | Freq: Four times a day (QID) | RECTAL | Status: DC | PRN

## 2015-10-01 MED ORDER — CLONIDINE HCL 0.2 MG PO TABS: 0.2000 mg | ORAL_TABLET | Freq: Two times a day (BID) | ORAL | Status: DC

## 2015-10-01 MED ORDER — TECHNETIUM TC 99M SESTAMIBI - CARDIOLITE
30.5600 | Freq: Once | INTRAVENOUS | Status: AC | PRN
  Administered 2015-10-01: 12:00:00 30.56 via INTRAVENOUS

## 2015-10-01 MED ORDER — ACETAMINOPHEN 325 MG PO TABS: 650.0000 mg | ORAL_TABLET | Freq: Four times a day (QID) | ORAL | Status: DC | PRN

## 2015-10-01 MED ORDER — NITROGLYCERIN 0.4 MG SL SUBL: 0.4000 mg | SUBLINGUAL_TABLET | SUBLINGUAL | Status: DC | PRN

## 2015-10-01 MED ORDER — HYDROCHLOROTHIAZIDE 25 MG PO TABS
25.0000 mg | ORAL_TABLET | Freq: Every day | ORAL | Status: DC
  Administered 2015-10-01: 25 mg via ORAL
  Filled 2015-10-01: qty 1

## 2015-10-01 MED ORDER — POTASSIUM CHLORIDE CRYS ER 20 MEQ PO TBCR
40.0000 meq | EXTENDED_RELEASE_TABLET | Freq: Once | ORAL | Status: AC
  Administered 2015-10-01: 40 meq via ORAL
  Filled 2015-10-01: qty 2

## 2015-10-01 MED ORDER — LISINOPRIL 20 MG PO TABS
20.0000 mg | ORAL_TABLET | Freq: Every day | ORAL | Status: DC
  Administered 2015-10-01: 20 mg via ORAL
  Filled 2015-10-01 (×3): qty 1

## 2015-10-01 MED ORDER — LISINOPRIL-HYDROCHLOROTHIAZIDE 20-25 MG PO TABS: 2.0000 | ORAL_TABLET | Freq: Every day | ORAL | Status: DC

## 2015-10-01 MED ORDER — HYDRALAZINE HCL 20 MG/ML IJ SOLN: 10.0000 mg | INTRAMUSCULAR | Status: DC | PRN

## 2015-10-01 MED ORDER — BUDESONIDE 0.25 MG/2ML IN SUSP
2.0000 mL | Freq: Two times a day (BID) | RESPIRATORY_TRACT | Status: DC | PRN
Start: 2015-10-01 — End: 2015-10-01

## 2015-10-01 MED ORDER — ATORVASTATIN CALCIUM 20 MG PO TABS: 40.0000 mg | ORAL_TABLET | Freq: Every day | ORAL | Status: DC

## 2015-10-01 MED ORDER — SIMVASTATIN 40 MG PO TABS
20.0000 mg | ORAL_TABLET | Freq: Every day | ORAL | Status: DC
  Administered 2015-10-01: 20 mg via ORAL
  Filled 2015-10-01: qty 1

## 2015-10-01 MED ORDER — SODIUM CHLORIDE 0.9% FLUSH
3.0000 mL | Freq: Two times a day (BID) | INTRAVENOUS | Status: DC
  Administered 2015-10-01: 3 mL via INTRAVENOUS

## 2015-10-01 MED ORDER — CLONIDINE HCL 0.1 MG PO TABS
0.1000 mg | ORAL_TABLET | Freq: Two times a day (BID) | ORAL | Status: DC
  Administered 2015-10-01: 0.1 mg via ORAL
  Filled 2015-10-01: qty 1

## 2015-10-01 MED ORDER — REGADENOSON 0.4 MG/5ML IV SOLN
0.4000 mg | Freq: Once | INTRAVENOUS | Status: AC
Start: 2015-10-01 — End: 2015-10-01
  Administered 2015-10-01: 0.4 mg via INTRAVENOUS

## 2015-10-01 MED ORDER — HYDRALAZINE HCL 20 MG/ML IJ SOLN
10.0000 mg | INTRAMUSCULAR | Status: DC | PRN
  Administered 2015-10-01 (×2): 10 mg via INTRAVENOUS
  Filled 2015-10-01 (×2): qty 1

## 2015-10-01 MED ORDER — TECHNETIUM TC 99M SESTAMIBI - CARDIOLITE
14.1300 | Freq: Once | INTRAVENOUS | Status: AC | PRN
  Administered 2015-10-01: 14.13 via INTRAVENOUS

## 2015-10-01 MED ORDER — INSULIN ASPART 100 UNIT/ML ~~LOC~~ SOLN
0.0000 [IU] | Freq: Four times a day (QID) | SUBCUTANEOUS | Status: DC
  Administered 2015-10-01 (×2): 2 [IU] via SUBCUTANEOUS
  Filled 2015-10-01 (×2): qty 2

## 2015-10-01 MED ORDER — ENOXAPARIN SODIUM 40 MG/0.4ML ~~LOC~~ SOLN
40.0000 mg | Freq: Every day | SUBCUTANEOUS | Status: DC
  Administered 2015-10-01: 40 mg via SUBCUTANEOUS
  Filled 2015-10-01 (×2): qty 0.4

## 2015-10-01 MED ORDER — ASPIRIN EC 81 MG PO TBEC
81.0000 mg | DELAYED_RELEASE_TABLET | Freq: Every day | ORAL | Status: DC
  Administered 2015-10-01: 81 mg via ORAL
  Filled 2015-10-01: qty 1

## 2015-10-01 MED ORDER — CLONIDINE HCL 0.1 MG PO TABS: 0.2000 mg | ORAL_TABLET | Freq: Two times a day (BID) | ORAL | Status: DC

## 2015-10-01 MED ORDER — POTASSIUM CHLORIDE 20 MEQ/15ML (10%) PO SOLN
40.0000 meq | Freq: Once | ORAL | Status: AC
  Administered 2015-10-01: 40 meq via ORAL
  Filled 2015-10-01: qty 30

## 2015-10-01 MED ORDER — POTASSIUM CHLORIDE CRYS ER 20 MEQ PO TBCR: 30.0000 meq | EXTENDED_RELEASE_TABLET | ORAL | Status: AC

## 2015-10-01 MED ORDER — POTASSIUM CHLORIDE 10 MEQ/100ML IV SOLN
10.0000 meq | INTRAVENOUS | Status: AC
  Administered 2015-10-01 (×2): 10 meq via INTRAVENOUS
  Filled 2015-10-01 (×2): qty 100

## 2015-10-01 MED ORDER — ONDANSETRON HCL 4 MG PO TABS
4.0000 mg | ORAL_TABLET | Freq: Four times a day (QID) | ORAL | Status: DC | PRN
  Administered 2015-10-01: 4 mg via ORAL
  Filled 2015-10-01: qty 1

## 2015-10-01 MED ORDER — OLOPATADINE HCL 0.1 % OP SOLN
1.0000 [drp] | Freq: Two times a day (BID) | OPHTHALMIC | Status: DC
  Filled 2015-10-01: qty 5

## 2015-10-01 MED ORDER — ENOXAPARIN SODIUM 100 MG/ML ~~LOC~~ SOLN
SUBCUTANEOUS | Status: AC
  Filled 2015-10-01: qty 1

## 2015-10-01 MED ORDER — ONDANSETRON HCL 4 MG/2ML IJ SOLN: 4.0000 mg | Freq: Four times a day (QID) | INTRAMUSCULAR | Status: DC | PRN

## 2015-10-01 NOTE — ED Notes (Signed)
Per verbal/phone communication, Dr Juliene Pina stated to nurse to give another 40 meq oral Potassium and to recheck lab at 1600 ; if potassium greater than 3.0 pt is able to go home. Give banana and orange juice.

## 2015-10-01 NOTE — Progress Notes (Signed)
[facility] - [place] at [facility]   PATIENT NAME: April Bauer    MR#:  [id number]  DATE OF BIRTH:  [birth year]  SUBJECTIVE:  Patient is currently chest pain-free. She denies shortness of breath.  REVIEW OF SYSTEMS:    Review of Systems  Constitutional: Negative for fever, chills and malaise/fatigue.  HENT: Negative for sore throat.   Eyes: Negative for blurred vision.  Respiratory: Negative for cough, hemoptysis, shortness of breath and wheezing.   Cardiovascular: Negative for chest pain, palpitations and leg swelling.  Gastrointestinal: Negative for nausea, vomiting, abdominal pain, diarrhea and blood in stool.  Genitourinary: Negative for dysuria.  Musculoskeletal: Negative for back pain.  Neurological: Negative for dizziness, tremors and headaches.  Endo/Heme/Allergies: Does not bruise/bleed easily.    Tolerating Diet:yes      DRUG ALLERGIES:   Allergies  Allergen Reactions  . Eggs Or Egg-Derived Products Shortness Of Breath  . Tramadol Nausea And Vomiting  . Vicodin [Hydrocodone-Acetaminophen] Other (See Comments)    Reaction:  Dizziness     VITALS:  Blood pressure 151/76, pulse 60, temperature 98.4 F (36.9 C), temperature source Oral, resp. rate 13, height 5' 5.5" (1.664 m), weight 90.719 kg (200 lb), SpO2 98 %.  PHYSICAL EXAMINATION:   Physical Exam  Constitutional: She is oriented to person, place, and time and well-developed, well-nourished, and in no distress. No distress.  HENT:  Head: Normocephalic.  Eyes: No scleral icterus.  Neck: Normal range of motion. Neck supple. No JVD present. No tracheal deviation present.  Cardiovascular: Normal rate, regular rhythm and normal heart sounds.  Exam reveals no gallop and no friction rub.   No murmur heard. Pulmonary/Chest: Effort normal and breath sounds normal. No respiratory distress. She has no wheezes. She has no rales. She exhibits no tenderness.  Abdominal: Soft. Bowel sounds  are normal. She exhibits no distension and no mass. There is no tenderness. There is no rebound and no guarding.  Musculoskeletal: Normal range of motion. She exhibits no edema.  Neurological: She is alert and oriented to person, place, and time.  Skin: Skin is warm. No rash noted. No erythema.  Psychiatric: Affect and judgment normal.      LABORATORY PANEL:   CBC  Recent Labs Lab 10/01/15 0504  WBC 6.4  HGB 11.2*  HCT 33.9*  PLT 201   ------------------------------------------------------------------------------------------------------------------  Chemistries   Recent Labs Lab 09/30/15 2018  10/01/15 0504  NA 140  --  138  K 3.1*  --  2.8*  CL 100*  --  100*  CO2 26  --  31  GLUCOSE 237*  --  163*  BUN 17  --  18  CREATININE 0.95  < > 0.75  CALCIUM 9.4  --  9.1  AST 27  --   --   ALT 24  --   --   ALKPHOS 94  --   --   BILITOT <0.1*  --   --   < > = values in this interval not displayed. ------------------------------------------------------------------------------------------------------------------  Cardiac Enzymes  Recent Labs Lab 09/30/15 2320 10/01/15 0221 10/01/15 0504  TROPONINI <0.03 0.03 <0.03   ------------------------------------------------------------------------------------------------------------------  RADIOLOGY:  Dg Chest Port 1 View  09/30/2015  CLINICAL DATA:  Intermittent chest pain.  Shortness of breath. EXAM: PORTABLE CHEST 1 VIEW COMPARISON:  None. FINDINGS: Monitoring leads overlie the patient. Enlarged cardiac and mediastinal contours. No consolidative pulmonary opacities. No pleural effusion or pneumothorax. Absence of the distal right clavicle, potentially posttraumatic in etiology. IMPRESSION:  Cardiomegaly. No acute cardiopulmonary process. Electronically Signed   By: Annia Belt M.D.   On: 09/30/2015 20:50     ASSESSMENT AND PLAN:   60 year old female with a history of hypertension, diabetes and hyperlipidemia who presented  chest pain.   1. Chest pain: Patient's troponins and seronegative. Patient is to undergo stress test. If her stress test is negative she may be able to be discharged home. If positive she may require cardiac catheterization.  2. Hypokalemia: Potassium is being repleted.  3. Essential hypertension: Blood pressure is not adequately controlled. I will increase clonidine to 0.2 mg by mouth twice a day for better blood pressure control. Continue lisinopril and HCTZ.  4. Hyperlipidemia:Continue ZOCOR.  5. Diabetes: Patient will continue insulin glargine, metformin and glipizide. Continue ADA diet. Management plans discussed with the patient and she is in agreement.  CODE STATUS: FULL  TOTAL TIME TAKING CARE OF THIS PATIENT: 30 minutes.   Discussed with cardiology POSSIBLE D/C today, DEPENDING ON CLINICAL CONDITION.   Makenzi Bannister M.D on 10/01/2015 at 12:02 PM  Between 7am to 6pm - Pager - [phone] After 6pm go to [email or web address] - password [email or web address]  [facility]  Office  [phone]  CC: Primary care physician; Olevia Perches, DO  Note: This dictation was prepared with Dragon dictation along with smaller phrase technology. Any transcriptional errors that result from this process are unintentional.

## 2015-10-01 NOTE — ED Notes (Signed)
Pt back from nuclear medicine.

## 2015-10-01 NOTE — ED Notes (Addendum)
Spoke with hospitalist concerning nitropaste placed by EMS. MD ordered nitropaste removed. Paste removed.

## 2015-10-01 NOTE — ED Notes (Signed)
Pt stated she would f/u with primary care provider for potassium level.

## 2015-10-01 NOTE — Consult Note (Signed)
CARDIOLOGY CONSULT NOTE     Primary Care Physician: Olevia Perches, DO Referring Physician:  Admit Date: 09/30/2015  Reason for consultation:  April Bauer is a 60 y.o. female with a h/o hypertension, hyperlipidemia, diabetes who presented to the emergency room with chest pain. She said that she was doing her normal activities when she had the sudden onset of substernal burning. She had some diaphoresis and nausea that was associated. She says that the pain continued through the time she went to the emergency room and was relieved by medications that she received. She has apparently had similar chest pain 3 years ago she was hospitalized and according to her underwent heart catheterization but no stents were placed in time. She continues to have mild discomfort, but her troponins have been negative 3. Nitroglycerin has helped her pain. She does say that her blood pressure was above 200 on her initial check.    Today, she denies symptoms of palpitations, chest pain, shortness of breath, orthopnea, PND, lower extremity edema, dizziness, presyncope, syncope, or neurologic sequela. The patient is tolerating medications without difficulties and is otherwise without complaint today.   Past Medical History  Diagnosis Date  . Hyperlipidemia   . Hypertension   . Diabetes mellitus without complication (HCC)   . Anxiety   . Asthma   . RA (rheumatoid arthritis) [facility])    Past Surgical History  Procedure Laterality Date  . Cholecystectomy    . Rotary cuff    . Tubal ligation    . Cardiac catherization      . aspirin EC  81 mg Oral Daily  . cloNIDine  0.1 mg Oral BID  . enoxaparin (LOVENOX) injection  40 mg Subcutaneous QHS  . lisinopril  20 mg Oral Daily   And  . hydrochlorothiazide  25 mg Oral Daily  . insulin aspart  0-9 Units Subcutaneous Q6H  . olopatadine  1 drop Both Eyes BID  . simvastatin  20 mg Oral Daily  . sodium chloride flush  3 mL Intravenous Q12H      Allergies  Allergen  Reactions  . Eggs Or Egg-Derived Products Shortness Of Breath  . Tramadol Nausea And Vomiting  . Vicodin [Hydrocodone-Acetaminophen] Other (See Comments)    Reaction:  Dizziness     Social History   Social History  . Marital Status: Married    Spouse Name: N/A  . Number of Children: N/A  . Years of Education: N/A   Occupational History  . Not on file.   Social History Main Topics  . Smoking status: Never Smoker   . Smokeless tobacco: Never Used  . Alcohol Use: 1.8 oz/week    3 Shots of liquor per week  . Drug Use: Yes    Special: Marijuana  . Sexual Activity: Yes   Other Topics Concern  . Not on file   Social History Narrative    Family History  Problem Relation Age of Onset  . Cancer Mother     Pancreatic  . Diabetes Sister   . Hypertension Sister   . Diabetes Brother   . Hypertension Brother   . Liver disease Brother     ROS- All systems are reviewed and negative except as per the HPI above  Physical Exam: Telemetry: Filed Vitals:   10/01/15 0649 10/01/15 0730 10/01/15 0900 10/01/15 0930  BP: 161/76 155/74 176/69 151/76  Pulse: 59 57 60   Temp:      TempSrc:      Resp: 12 14 23  13  Height:      Weight:      SpO2: 97% 96% 98%     GEN- The patient is well appearing, alert and oriented x 3 today.   Head- normocephalic, atraumatic Eyes-  Sclera clear, conjunctiva pink Ears- hearing intact Oropharynx- clear Neck- supple, no JVP Lymph- no cervical lymphadenopathy Lungs- Clear to ausculation bilaterally, normal work of breathing Heart- Regular rate and rhythm, no murmurs, rubs or gallops, PMI not laterally displaced GI- soft, NT, ND, + BS Extremities- no clubbing, cyanosis, or edema MS- no significant deformity or atrophy Skin- no rash or lesion Psych- euthymic mood, full affect Neuro- strength and sensation are intact  EKG: Sinus rhythm, LVH by voltage, possible RVH  Labs:   Lab Results  Component Value Date   WBC 6.4 10/01/2015   HGB  11.2* 10/01/2015   HCT 33.9* 10/01/2015   MCV 77.5* 10/01/2015   PLT 201 10/01/2015    Recent Labs Lab 09/30/15 2018  10/01/15 0504  NA 140  --  138  K 3.1*  --  2.8*  CL 100*  --  100*  CO2 26  --  31  BUN 17  --  18  CREATININE 0.95  < > 0.75  CALCIUM 9.4  --  9.1  PROT 8.0  --   --   BILITOT <0.1*  --   --   ALKPHOS 94  --   --   ALT 24  --   --   AST 27  --   --   GLUCOSE 237*  --  163*  < > = values in this interval not displayed. Lab Results  Component Value Date   TROPONINI <0.03 10/01/2015   No results found for: CHOL No results found for: HDL No results found for: LDLCALC No results found for: TRIG No results found for: CHOLHDL No results found for: LDLDIRECT    Radiology:No acute cardiopulmonary process.  Echo:pending  ASSESSMENT AND PLAN:   1. Chest pain: Pain is typical in that it was relieved with nitroglycerin. Fortunately she has had 3 negative troponins and is chest pain-free at the moment. Atypical features of her chest pain her that she was not exerting herself and it is a burning type of chest pain. We'll plan on stress testing today to see if her pain is cardiac in nature. Stress test is negative, would maximize her medication regimen. If positive she may require heart catheterization.  2. Hypokalemia: Potassium currently 2.8, Bixby Jon replete. Jaxsyn Catalfamo recheck magnesium and BMP tomorrow AM  3. Hypertension: continue home meds, may need to be maximized as not well controlled  4. Hyperlipidemia: switch to atorvastatin due to risk factors  Janette Harvie Jorja Loa, MD 10/01/2015  10:22 AM

## 2015-10-01 NOTE — ED Notes (Signed)
Introduced self to family and patient. NAD noted. Room Cleaned of debris.

## 2015-10-01 NOTE — Discharge Summary (Signed)
[facility] Physicians - [place] at [facility]   PATIENT NAME: April Bauer    MR#:  [id number]  DATE OF BIRTH:  24-Mar-1956  DATE OF ADMISSION:  09/30/2015 ADMITTING PHYSICIAN: No admitting provider for patient encounter.  DATE OF DISCHARGE: *10/01/2015  PRIMARY CARE PHYSICIAN: Olevia Perches, DO    ADMISSION DIAGNOSIS:  CHEST PAIN   DISCHARGE DIAGNOSIS:  Principal Problem:   Chest pain Active Problems:   Hyperlipemia   HTN (hypertension)   Type 2 diabetes mellitus (HCC)   Anxiety   SECONDARY DIAGNOSIS:   Past Medical History  Diagnosis Date  . Hyperlipidemia   . Hypertension   . Diabetes mellitus without complication (HCC)   . Anxiety   . Asthma   . RA (rheumatoid arthritis) [facility])     HOSPITAL COURSE:  60 year old female with a history of hypertension, diabetes and hyperlipidemia who presented chest pain.   1. Chest pain: Patient's troponins were negative and stress test was low risk. Her chest pain was atpical in nature.   2. Hypokalemia: Potassium is being repleted.  3. Essential hypertension: Blood pressure is not adequately controlled. I will increase clonidine to 0.2 mg by mouth twice a day for better blood pressure control. Continue lisinopril and HCTZ.  4. Hyperlipidemia:Continue ZOCOR.  5. Diabetes: Patient will continue insulin glargine, metformin and glipizide. Continue ADA diet. Management plans discussed with the patient and she is in agreement.   DISCHARGE CONDITIONS AND DIET:  stable heart healthy diabetic diet  CONSULTS OBTAINED:     DRUG ALLERGIES:   Allergies  Allergen Reactions  . Eggs Or Egg-Derived Products Shortness Of Breath  . Tramadol Nausea And Vomiting  . Vicodin [Hydrocodone-Acetaminophen] Other (See Comments)    Reaction:  Dizziness     DISCHARGE MEDICATIONS:   Current Discharge Medication List    START taking these medications   Details  nitroGLYCERIN (NITROSTAT) 0.4 MG SL tablet Place 1 tablet (0.4 mg  total) under the tongue every 5 (five) minutes as needed for chest pain. Qty: 30 tablet, Refills: 12      CONTINUE these medications which have CHANGED   Details  cloNIDine (CATAPRES) 0.2 MG tablet Take 1 tablet (0.2 mg total) by mouth 2 (two) times daily. Qty: 60 tablet, Refills: 11      CONTINUE these medications which have NOT CHANGED   Details  aspirin EC 81 MG tablet Take 81 mg by mouth daily.    beclomethasone (QVAR) 40 MCG/ACT inhaler Inhale 2 puffs into the lungs 2 (two) times daily as needed (for shortness of breath/wheezing).    cyclobenzaprine (FLEXERIL) 10 MG tablet Take 1 tablet (10 mg total) by mouth 3 (three) times daily as needed for muscle spasms. Qty: 90 tablet, Refills: 0    fluticasone (FLONASE) 50 MCG/ACT nasal spray Place 2 sprays into both nostrils daily. Qty: 16 g, Refills: 6    glipiZIDE (GLUCOTROL XL) 10 MG 24 hr tablet Take 1 tablet (10 mg total) by mouth daily with breakfast. Qty: 90 tablet, Refills: 1    Insulin Glargine (TOUJEO SOLOSTAR) 300 UNIT/ML SOPN Inject 25 Units into the skin daily. Qty: 3 pen, Refills: 3    lisinopril-hydrochlorothiazide (PRINZIDE,ZESTORETIC) 20-25 MG tablet Take 2 tablets by mouth daily. Qty: 60 tablet, Refills: 6    meloxicam (MOBIC) 15 MG tablet Take 1 tablet (15 mg total) by mouth daily. Qty: 30 tablet, Refills: 3    metFORMIN (GLUCOPHAGE) 850 MG tablet Take 1 tablet (850 mg total) by mouth 2 (two) times  daily with a meal. Qty: 180 tablet, Refills: 1    Olopatadine HCl (PATADAY) 0.2 % SOLN Place 1 drop into both eyes as needed (for allergies).     simvastatin (ZOCOR) 20 MG tablet Take 1 tablet (20 mg total) by mouth daily. Qty: 90 tablet, Refills: 1    triamcinolone cream (KENALOG) 0.1 % Apply 1 application topically as needed (for itching).     Dulaglutide (TRULICITY) 0.75 MG/0.5ML SOPN Inject 0.75 mg into the skin once a week. Qty: 4 pen, Refills: 3      STOP taking these medications     ibuprofen  (ADVIL,MOTRIN) 600 MG tablet               Today   CHIEF COMPLAINT:  No chest pain overnight   VITAL SIGNS:  Blood pressure 151/76, pulse 60, temperature 98.4 F (36.9 C), temperature source Oral, resp. rate 13, height 5' 5.5" (1.664 m), weight 90.719 kg (200 lb), SpO2 98 %.   REVIEW OF SYSTEMS:  Review of Systems  Constitutional: Negative for fever, chills and malaise/fatigue.  HENT: Negative for sore throat.   Eyes: Negative for blurred vision.  Respiratory: Negative for cough, hemoptysis, shortness of breath and wheezing.   Cardiovascular: Negative for chest pain, palpitations and leg swelling.  Gastrointestinal: Negative for nausea, vomiting, abdominal pain, diarrhea and blood in stool.  Genitourinary: Negative for dysuria.  Musculoskeletal: Negative for back pain.  Neurological: Negative for dizziness, tremors and headaches.  Endo/Heme/Allergies: Does not bruise/bleed easily.     PHYSICAL EXAMINATION:  GENERAL:  60 y.o.-year-old patient lying in the bed with no acute distress.  NECK:  Supple, no jugular venous distention. No thyroid enlargement, no tenderness.  LUNGS: Normal breath sounds bilaterally, no wheezing, rales,rhonchi  No use of accessory muscles of respiration.  CARDIOVASCULAR: S1, S2 normal. No murmurs, rubs, or gallops.  ABDOMEN: Soft, non-tender, non-distended. Bowel sounds present. No organomegaly or mass.  EXTREMITIES: No pedal edema, cyanosis, or clubbing.  PSYCHIATRIC: The patient is alert and oriented x 3.  SKIN: No obvious rash, lesion, or ulcer.   DATA REVIEW:   CBC  Recent Labs Lab 10/01/15 0504  WBC 6.4  HGB 11.2*  HCT 33.9*  PLT 201    Chemistries   Recent Labs Lab 09/30/15 2018  10/01/15 0504  NA 140  --  138  K 3.1*  --  2.8*  CL 100*  --  100*  CO2 26  --  31  GLUCOSE 237*  --  163*  BUN 17  --  18  CREATININE 0.95  < > 0.75  CALCIUM 9.4  --  9.1  AST 27  --   --   ALT 24  --   --   ALKPHOS 94  --   --    BILITOT <0.1*  --   --   < > = values in this interval not displayed.  Cardiac Enzymes  Recent Labs Lab 09/30/15 2320 10/01/15 0221 10/01/15 0504  TROPONINI <0.03 0.03 <0.03    Microbiology Results  @[email or web address] @  RADIOLOGY:  Dg Chest Port 1 View  09/30/2015  CLINICAL DATA:  Intermittent chest pain.  Shortness of breath. EXAM: PORTABLE CHEST 1 VIEW COMPARISON:  None. FINDINGS: Monitoring leads overlie the patient. Enlarged cardiac and mediastinal contours. No consolidative pulmonary opacities. No pleural effusion or pneumothorax. Absence of the distal right clavicle, potentially posttraumatic in etiology. IMPRESSION: Cardiomegaly. No acute cardiopulmonary process. Electronically Signed   By: Annia Belt M.D.   On: 09/30/2015  20:50      Management plans discussed with the patient and she is in agreement. Stable for discharge   Patient should follow up with pcp in 1 week  CODE STATUS:     Code Status Orders        Start     Ordered   10/01/15 0147  Full code   Continuous     10/01/15 0147    Code Status History    Date Active Date Inactive Code Status Order ID Comments User Context   This patient has a current code status but no historical code status.      TOTAL TIME TAKING CARE OF THIS PATIENT: 35 minutes.    Note: This dictation was prepared with Dragon dictation along with smaller phrase technology. Any transcriptional errors that result from this process are unintentional.  Alvar Malinoski M.D on 10/01/2015 at 12:11 PM  Between 7am to 6pm - Pager - [phone] After 6pm go to [email or web address] - password [email or web address] [facility]  [facility]  Office  [phone]  CC: Primary care physician; Olevia Perches, DO

## 2015-10-01 NOTE — ED Notes (Signed)
Patient a+o, no complaints at this time. Hospital bed ordered. Patient notified of delay.

## 2015-10-01 NOTE — ED Notes (Signed)
Called admitting MD about pt's BP. MD ordered to give PRN hydralazine

## 2015-10-01 NOTE — ED Notes (Signed)
Per verbal communication , Dr. Juliene Pina stated if stress test is negative the patient can go home; if the stress test is positive pt to be admitted. Nurse to print out AVS.

## 2015-10-01 NOTE — ED Notes (Signed)
Pt in nuclear medicine per previous nurse Dorinda Hill. Family member at bedside

## 2015-10-01 NOTE — ED Notes (Signed)
Hospital bed arrived, patient opted to sit in recliner instead. Family at bedside. NAD noted. Patient to be transported to [facility] now.

## 2015-10-05 ENCOUNTER — Other Ambulatory Visit: Payer: Self-pay | Admitting: Family Medicine

## 2015-10-05 ENCOUNTER — Ambulatory Visit (INDEPENDENT_AMBULATORY_CARE_PROVIDER_SITE_OTHER): Payer: 59 | Admitting: Cardiology

## 2015-10-05 ENCOUNTER — Ambulatory Visit (INDEPENDENT_AMBULATORY_CARE_PROVIDER_SITE_OTHER): Payer: 59 | Admitting: Family Medicine

## 2015-10-05 ENCOUNTER — Encounter: Payer: Self-pay | Admitting: Family Medicine

## 2015-10-05 ENCOUNTER — Encounter: Payer: Self-pay | Admitting: Cardiology

## 2015-10-05 VITALS — BP 146/92 | HR 58 | Ht 65.5 in | Wt 196.6 lb

## 2015-10-05 VITALS — BP 148/91 | HR 58 | Temp 97.8°F | Ht 65.5 in | Wt 198.4 lb

## 2015-10-05 DIAGNOSIS — Z794 Long term (current) use of insulin: Secondary | ICD-10-CM | POA: Diagnosis not present

## 2015-10-05 DIAGNOSIS — I129 Hypertensive chronic kidney disease with stage 1 through stage 4 chronic kidney disease, or unspecified chronic kidney disease: Secondary | ICD-10-CM | POA: Diagnosis not present

## 2015-10-05 DIAGNOSIS — E785 Hyperlipidemia, unspecified: Secondary | ICD-10-CM | POA: Diagnosis not present

## 2015-10-05 DIAGNOSIS — E1165 Type 2 diabetes mellitus with hyperglycemia: Secondary | ICD-10-CM

## 2015-10-05 DIAGNOSIS — R079 Chest pain, unspecified: Secondary | ICD-10-CM | POA: Diagnosis not present

## 2015-10-05 LAB — BAYER DCA HB A1C WAIVED: HB A1C (BAYER DCA - WAIVED): 8 % — ABNORMAL HIGH (ref <7.0)

## 2015-10-05 MED ORDER — MELOXICAM 15 MG PO TABS: 15.0000 mg | ORAL_TABLET | Freq: Every day | ORAL | Status: AC

## 2015-10-05 MED ORDER — CYCLOBENZAPRINE HCL 10 MG PO TABS: 10.0000 mg | ORAL_TABLET | Freq: Three times a day (TID) | ORAL | Status: AC | PRN

## 2015-10-05 NOTE — Assessment & Plan Note (Signed)
Better today than it has been. On increased clonidine for 4 days. Interested in taking hydralazine daily. Will discuss this with cardiology. To see cardiology today.

## 2015-10-05 NOTE — Assessment & Plan Note (Signed)
Rechecking levels today. Await results and adjust as needed.  

## 2015-10-05 NOTE — Patient Instructions (Addendum)
Medication Instructions:  Your physician has recommended you make the following change in your medication: 1) START Prilosec (over the counter)  Labwork: None ordered  Testing/Procedures: None ordered  Follow-Up: No follow up is needed at this time with cardiology.  [facility] will see you on an as needed basis. Please call the office if Prilosec does not improve your symptoms.  Thank you for choosing [facility]!!   Dory Horn, RN [phone]

## 2015-10-05 NOTE — Progress Notes (Signed)
Electrophysiology Office Note   Date:  10/05/2015   ID:  April Bauer, DOB 1956/03/31, MRN [id number]  PCP:  Olevia Perches, DO  Cardiologist:  Latiana Tomei Jorja Loa, MD    Chief Complaint  Patient presents with  . Hospitalization Follow-up     History of Present Illness: April Bauer is a 60 y.o. female who presents today for electrophysiology evaluation.   She presented to the emergency room on 09/30/15 with chest pain. She was doing her normal activities when she suddenly had the onset of substernal burning sensation. She did have diaphoresis and nausea. She has had pain like this in the past, and had a catheterization done without stent placement. She does not know the results of the catheterization. On presentation the emergency room her EKG was unchanged from previous, and her troponin's were negative 3. She had stress testing done which showed no evidence of ischemia and was a low risk study. Her EF was found to be 55-65%. She continues to have chest pain. The most recent episode of pain was and she is checking out of her primary care physician's office where she had a stabbing pain in the left side of her chest that went to her back. The pain lasted a few minutes and went away on its own. She was standing in line not exerting herself.   Today, she denies symptoms of palpitations, lower extremity edema, claudication, dizziness, presyncope, syncope, bleeding, or neurologic sequela. The patient is tolerating medications without difficulties and is otherwise without complaint today.    Past Medical History  Diagnosis Date  . Hyperlipidemia   . Hypertension   . Diabetes mellitus without complication (HCC)   . Anxiety   . Asthma   . RA (rheumatoid arthritis) [facility])    Past Surgical History  Procedure Laterality Date  . Cholecystectomy    . Rotary cuff    . Tubal ligation    . Cardiac catherization    . Lithotripsy      kidney stones     Current Outpatient Prescriptions    Medication Sig Dispense Refill  . aspirin EC 81 MG tablet Take 81 mg by mouth daily.    . beclomethasone (QVAR) 40 MCG/ACT inhaler Inhale 2 puffs into the lungs 2 (two) times daily as needed (for shortness of breath/wheezing).    . cloNIDine (CATAPRES) 0.2 MG tablet Take 1 tablet (0.2 mg total) by mouth 2 (two) times daily. 60 tablet 11  . cyclobenzaprine (FLEXERIL) 10 MG tablet Take 1 tablet (10 mg total) by mouth 3 (three) times daily as needed for muscle spasms. (Patient taking differently: Take 10 mg by mouth 3 (three) times daily. ) 90 tablet 0  . Dulaglutide (TRULICITY) 0.75 MG/0.5ML SOPN Inject 0.75 mg into the skin once a week. 4 pen 3  . fluticasone (FLONASE) 50 MCG/ACT nasal spray Place 2 sprays into both nostrils daily. (Patient taking differently: Place 2 sprays into both nostrils daily as needed for rhinitis. ) 16 g 6  . glipiZIDE (GLUCOTROL XL) 10 MG 24 hr tablet Take 1 tablet (10 mg total) by mouth daily with breakfast. 90 tablet 1  . ibuprofen (ADVIL,MOTRIN) 600 MG tablet Take 600 mg by mouth every 6 (six) hours as needed.    . Insulin Glargine (TOUJEO SOLOSTAR) 300 UNIT/ML SOPN Inject 25 Units into the skin daily. 3 pen 3  . lisinopril-hydrochlorothiazide (PRINZIDE,ZESTORETIC) 20-25 MG tablet Take 2 tablets by mouth daily. 60 tablet 6  . meloxicam (MOBIC) 15 MG tablet Take 1  tablet (15 mg total) by mouth daily. 30 tablet 3  . metFORMIN (GLUCOPHAGE) 850 MG tablet Take 1 tablet (850 mg total) by mouth 2 (two) times daily with a meal. 180 tablet 1  . nitroGLYCERIN (NITROSTAT) 0.4 MG SL tablet Place 1 tablet (0.4 mg total) under the tongue every 5 (five) minutes as needed for chest pain. 30 tablet 12  . Olopatadine HCl (PATADAY) 0.2 % SOLN Place 1 drop into both eyes as needed (for allergies).     . simvastatin (ZOCOR) 20 MG tablet Take 1 tablet (20 mg total) by mouth daily. 90 tablet 1  . triamcinolone cream (KENALOG) 0.1 % Apply 1 application topically as needed (for itching).       No current facility-administered medications for this visit.    Allergies:   Eggs or egg-derived products; Tramadol; and Vicodin   Social History:  The patient  reports that she has never smoked. She has never used smokeless tobacco. She reports that she does not drink alcohol or use illicit drugs.   Family History:  The patient's family history includes Cancer in her mother; Diabetes in her brother and sister; Hypertension in her brother and sister; Liver disease in her brother.    ROS:  Please see the history of present illness.   Otherwise, review of systems is positive for chest pain, snoring, back pain, dizziness.   All other systems are reviewed and negative.    PHYSICAL EXAM: VS:  BP 146/92 mmHg  Pulse 58  Ht 5' 5.5" (1.664 m)  Wt 196 lb 9.6 oz (89.177 kg)  BMI 32.21 kg/m2 , BMI Body mass index is 32.21 kg/(m^2). GEN: Well nourished, well developed, in no acute distress HEENT: normal Neck: no JVD, carotid bruits, or masses Cardiac: RRR; no murmurs, rubs, or gallops,no edema  Respiratory:  clear to auscultation bilaterally, normal work of breathing GI: soft, nontender, nondistended, + BS MS: no deformity or atrophy Skin: warm and dry Neuro:  Strength and sensation are intact Psych: euthymic mood, full affect  EKG:  EKG is not ordered today.  Recent Labs: 06/06/2015: TSH 0.952 09/30/2015: ALT 24 10/01/2015: BUN 18; Creatinine, Ser 0.75; Hemoglobin 11.2*; Platelets 201; Potassium 4.3; Sodium 138    Lipid Panel  No results found for: CHOL, TRIG, HDL, CHOLHDL, VLDL, LDLCALC, LDLDIRECT   Wt Readings from Last 3 Encounters:  10/05/15 196 lb 9.6 oz (89.177 kg)  10/05/15 198 lb 6.4 oz (89.994 kg)  09/30/15 200 lb (90.719 kg)      Other studies Reviewed: Additional studies/ records that were reviewed today include: Spect 10/01/15  Review of the above records today demonstrates:   Defect 1: There is a small defect of mild severity present in the apex location. This is  likely due to breast attenuation.  There was no ST segment deviation noted during stress.  No T wave inversion was noted during stress.  The study is normal. However, the study is suboptimal due to GI uptake.  This is a low risk study.  The left ventricular ejection fraction is normal (55-65%).   ASSESSMENT AND PLAN:  1.  Chest pain: at this point her chest pain does not appear to be cardiac. She has had an EKG which was unchanged from previous, as well as troponin 3 which were negative. She also had a nuclear stress test that was low risk and had a normal EF. It is possible that her discomfort is due to GI issues. I have told her to take Prilosec over-the-counter  to see if that Amandamarie Feggins help. I told her that if this does not improve her pain to call the clinic back and we Jama Mcmiller address her issues then.   Current medicines are reviewed at length with the patient today.   The patient does not have concerns regarding her medicines.  The following changes were made today:  none  Labs/ tests ordered today include:  No orders of the defined types were placed in this encounter.     Disposition:   FU PRN  Signed, Edelyn Heidel Jorja Loa, MD  10/05/2015 2:23 PM     [facility] [address] [place] [address] [phone] (office) [phone] (fax)

## 2015-10-05 NOTE — Assessment & Plan Note (Signed)
Still not under great control. A1c again at 8.0. Working on remembering to take her medicine daily. Working on diet. Recheck 3 months.

## 2015-10-05 NOTE — Progress Notes (Signed)
BP 148/91 mmHg  Pulse 58  Temp(Src) 97.8 F (36.6 C)  Ht 5' 5.5" (1.664 m)  Wt 198 lb 6.4 oz (89.994 kg)  BMI 32.50 kg/m2  SpO2 99%   Subjective:    Patient ID: April Bauer, female    DOB: 1956/01/06, 60 y.o.   MRN: [id number]  HPI: Janye Bauer is a 60 y.o. female  Chief Complaint  Patient presents with  . Follow-up  . Chest Pain  . Diabetes   Seeing Cardiology this afternoon. Was in the Hospital on 2/3 -2/84for chest pain.   Hospital FOLLOW UP Time since discharge: 4 days Hospital/facility: 5 days Diagnosis: Chest pain, hypokalemia, HTN Procedures/tests: Trops negx3, EKG unchanged from previous, stress test- no evidence of ischemia, low risk, EF 55-65% Consultants: Cardiology New medications: Clonidine increased Discharge instructions:  Follow up with cardiology and [place] Status: stable- better than it was, but still going on.   HYPERTENSION Hypertension status: better  Satisfied with current treatment? no Duration of hypertension: chronic BP monitoring frequency:  a few times a day BP range: up and down BP medication side effects:  no Medication compliance: excellent compliance Aspirin: yes Recurrent headaches: yes Visual changes: yes Palpitations: yes Dyspnea: yes Chest pain: yes Lower extremity edema: yes Dizzy/lightheaded: yes  DIABETES Hypoglycemic episodes:no Polydipsia/polyuria: no Visual disturbance: yes Chest pain: yes Paresthesias: yes running up her legs Glucose Monitoring: yes  Accucheck frequency: Daily  Fasting glucose: 150s 1x up to 200s Taking Insulin?: yes Blood Pressure Monitoring: a few times a day Retinal Examination: Up to Date Foot Exam: Up to Date Diabetic Education: Completed Pneumovax: Refused Influenza: REfused Aspirin: no  Relevant past medical, surgical, family and social history reviewed and updated as indicated. Interim medical history since our last visit reviewed. Allergies and medications reviewed and updated.  Review  of Systems  Constitutional: Negative.   Respiratory: Positive for chest tightness and shortness of breath. Negative for apnea, cough, choking, wheezing and stridor.   Cardiovascular: Positive for chest pain.  Gastrointestinal: Negative.   Psychiatric/Behavioral: Negative.     Per HPI unless specifically indicated above     Objective:    BP 148/91 mmHg  Pulse 58  Temp(Src) 97.8 F (36.6 C)  Ht 5' 5.5" (1.664 m)  Wt 198 lb 6.4 oz (89.994 kg)  BMI 32.50 kg/m2  SpO2 99%  Wt Readings from Last 3 Encounters:  10/05/15 198 lb 6.4 oz (89.994 kg)  09/30/15 200 lb (90.719 kg)  08/24/15 202 lb (91.627 kg)    Physical Exam  Constitutional: She is oriented to person, place, and time. She appears well-developed and well-nourished. No distress.  HENT:  Head: Normocephalic and atraumatic.  Right Ear: Hearing normal.  Left Ear: Hearing normal.  Nose: Nose normal.  Eyes: Conjunctivae and lids are normal. Right eye exhibits no discharge. Left eye exhibits no discharge. No scleral icterus.  Cardiovascular: Normal rate, regular rhythm, normal heart sounds and intact distal pulses.  Exam reveals no gallop and no friction rub.   No murmur heard. Pulmonary/Chest: Effort normal and breath sounds normal. No respiratory distress. She has no wheezes. She has no rales. She exhibits tenderness (Over 5th costo-sternal joint, reproduces pain).  Musculoskeletal: Normal range of motion.  Neurological: She is alert and oriented to person, place, and time.  Skin: Skin is warm, dry and intact. No rash noted. No erythema. No pallor.  Psychiatric: She has a normal mood and affect. Her speech is normal and behavior is normal. Judgment and thought content normal. Cognition  and memory are normal.  Nursing note and vitals reviewed.   Results for orders placed or performed in visit on 10/05/15  Bayer DCA Hb A1c Waived  Result Value Ref Range   Bayer DCA Hb A1c Waived 8.0 (H) <7.0 %      Assessment & Plan:    Problem List Items Addressed This Visit      Endocrine   Type 2 diabetes mellitus (HCC)    Still not under great control. A1c again at 8.0. Working on remembering to take her medicine daily. Working on diet. Recheck 3 months.       Relevant Orders   Bayer DCA Hb A1c Waived (Completed)   CBC with Differential/Platelet   Comprehensive metabolic panel     Other   Hyperlipemia    Rechecking levels today. Await results and adjust as needed.       Chest pain - Primary    Other Visit Diagnoses    Benign hypertensive renal disease        Relevant Orders    CBC with Differential/Platelet    Comprehensive metabolic panel    Hyperlipidemia        Relevant Orders    CBC with Differential/Platelet    Comprehensive metabolic panel    Lipid Panel w/o Chol/HDL Ratio        Follow up plan: Return in about 2 weeks (around 10/19/2015).

## 2015-10-06 ENCOUNTER — Encounter: Payer: Self-pay | Admitting: Family Medicine

## 2015-10-06 LAB — CBC WITH DIFFERENTIAL/PLATELET
Basophils Absolute: 0 10*3/uL (ref 0.0–0.2)
Basos: 1 %
EOS (ABSOLUTE): 0.6 10*3/uL — ABNORMAL HIGH (ref 0.0–0.4)
Eos: 7 %
Hematocrit: 37.1 % (ref 34.0–46.6)
Hemoglobin: 12.1 g/dL (ref 11.1–15.9)
Immature Grans (Abs): 0 10*3/uL (ref 0.0–0.1)
Immature Granulocytes: 0 %
Lymphocytes Absolute: 2.8 10*3/uL (ref 0.7–3.1)
Lymphs: 37 %
MCH: 24.8 pg — ABNORMAL LOW (ref 26.6–33.0)
MCHC: 32.6 g/dL (ref 31.5–35.7)
MCV: 76 fL — ABNORMAL LOW (ref 79–97)
Monocytes Absolute: 0.7 10*3/uL (ref 0.1–0.9)
Monocytes: 9 %
Neutrophils Absolute: 3.6 10*3/uL (ref 1.4–7.0)
Neutrophils: 46 %
Platelets: 276 10*3/uL (ref 150–379)
RBC: 4.88 x10E6/uL (ref 3.77–5.28)
RDW: 13.6 % (ref 12.3–15.4)
WBC: 7.7 10*3/uL (ref 3.4–10.8)

## 2015-10-06 LAB — COMPREHENSIVE METABOLIC PANEL
ALT: 23 IU/L (ref 0–32)
AST: 21 IU/L (ref 0–40)
Albumin/Globulin Ratio: 1.5 (ref 1.1–2.5)
Albumin: 4.5 g/dL (ref 3.5–5.5)
Alkaline Phosphatase: 104 IU/L (ref 39–117)
BUN/Creatinine Ratio: 16 (ref 9–23)
BUN: 14 mg/dL (ref 6–24)
Bilirubin Total: 0.2 mg/dL (ref 0.0–1.2)
CO2: 28 mmol/L (ref 18–29)
Calcium: 10.5 mg/dL — ABNORMAL HIGH (ref 8.7–10.2)
Chloride: 98 mmol/L (ref 96–106)
Creatinine, Ser: 0.88 mg/dL (ref 0.57–1.00)
GFR calc Af Amer: 84 mL/min/{1.73_m2} (ref >59)
GFR calc non Af Amer: 73 mL/min/{1.73_m2} (ref >59)
Globulin, Total: 3.1 g/dL (ref 1.5–4.5)
Glucose: 162 mg/dL — ABNORMAL HIGH (ref 65–99)
Potassium: 4.4 mmol/L (ref 3.5–5.2)
Sodium: 141 mmol/L (ref 134–144)
Total Protein: 7.6 g/dL (ref 6.0–8.5)

## 2015-10-06 LAB — LIPID PANEL W/O CHOL/HDL RATIO
Cholesterol, Total: 155 mg/dL (ref 100–199)
HDL: 32 mg/dL — ABNORMAL LOW (ref >39)
LDL Calculated: 76 mg/dL (ref 0–99)
Triglycerides: 233 mg/dL — ABNORMAL HIGH (ref 0–149)
VLDL Cholesterol Cal: 47 mg/dL — ABNORMAL HIGH (ref 5–40)

## 2015-10-10 ENCOUNTER — Telehealth: Payer: Self-pay | Admitting: Family Medicine

## 2015-10-10 NOTE — Telephone Encounter (Signed)
[employer] needs a diagnosis on the pts fmla paperwork. They are faxing it back over so that information can be added.

## 2015-10-19 ENCOUNTER — Encounter: Payer: Self-pay | Admitting: Family Medicine

## 2015-10-19 ENCOUNTER — Ambulatory Visit (INDEPENDENT_AMBULATORY_CARE_PROVIDER_SITE_OTHER): Payer: 59 | Admitting: Family Medicine

## 2015-10-19 VITALS — BP 171/80 | HR 58 | Temp 97.6°F | Ht 64.6 in | Wt 201.0 lb

## 2015-10-19 DIAGNOSIS — Z794 Long term (current) use of insulin: Secondary | ICD-10-CM | POA: Diagnosis not present

## 2015-10-19 DIAGNOSIS — E1165 Type 2 diabetes mellitus with hyperglycemia: Secondary | ICD-10-CM

## 2015-10-19 DIAGNOSIS — R079 Chest pain, unspecified: Secondary | ICD-10-CM

## 2015-10-19 DIAGNOSIS — K219 Gastro-esophageal reflux disease without esophagitis: Secondary | ICD-10-CM | POA: Diagnosis not present

## 2015-10-19 DIAGNOSIS — I10 Essential (primary) hypertension: Secondary | ICD-10-CM | POA: Diagnosis not present

## 2015-10-19 DIAGNOSIS — R609 Edema, unspecified: Secondary | ICD-10-CM

## 2015-10-19 MED ORDER — OMEPRAZOLE 40 MG PO CPDR: 40.0000 mg | DELAYED_RELEASE_CAPSULE | Freq: Every day | ORAL | Status: DC

## 2015-10-19 MED ORDER — GLIPIZIDE ER 10 MG PO TB24: 10.0000 mg | ORAL_TABLET | Freq: Every day | ORAL | Status: DC

## 2015-10-19 NOTE — Assessment & Plan Note (Signed)
Restart glipizide. Watch sugars. Likely cause of blurred vision.

## 2015-10-19 NOTE — Progress Notes (Signed)
BP 171/80 mmHg  Pulse 58  Temp(Src) 97.6 F (36.4 C)  Ht 5' 4.6" (1.641 m)  Wt 201 lb (91.173 kg)  BMI 33.86 kg/m2  SpO2 100%   Subjective:    Patient ID: April Bauer, female    DOB: 1956/04/19, 60 y.o.   MRN: [id number]  HPI: April Bauer is a 60 y.o. female  Chief Complaint  Patient presents with  . Chest Pain    Patient needs a return to work note  . Gas    Cardiology said patient needs to be on Protonix  . Eye Problem    Patient states that her vision has been going in and out, things will become real blurry. She just went to the eye doctor, she was given a new pair of glasses. Patient states that this was not going on at the time of her visit to the eye doctor   Micah Flesher to see cardiology. They did not think this was cardiac related. They thought it was possibly due to GI issues and recommended that she take OTC prilosec. She is feeling much better and chest pain is almost entirely gone. She has it occasionally when she gets really anxious, but is good to go back to work.   GERD GERD control status: better  Satisfied with current treatment? yes Heartburn frequency: a couple of times a week Medication side effects: no  Medication compliance: excellent Previous GERD medications: protonix Antacid use frequency:  rarely Dysphagia: no Odynophagia:  no Hematemesis: no Blood in stool: no EGD: no  Has been having some swelling in her legs. Worse on hot days. Worse when standing on them long periods. Has not tried anything. Goes away completely when she lays down at night.   Has some increased blurred vision when her sugar is high. Just went to the eye doctor. Sugar was 200 today, and vision was blurred. Only happening occasionally. Hasn't been taking her glipizide.   Relevant past medical, surgical, family and social history reviewed and updated as indicated. Interim medical history since our last visit reviewed. Allergies and medications reviewed and updated.  Review of Systems   Respiratory: Negative.   Cardiovascular: Negative.   Gastrointestinal: Positive for nausea, abdominal pain, diarrhea, constipation and abdominal distention. Negative for vomiting, blood in stool, anal bleeding and rectal pain.  Genitourinary: Negative.   Psychiatric/Behavioral: Negative.     Per HPI unless specifically indicated above     Objective:    BP 171/80 mmHg  Pulse 58  Temp(Src) 97.6 F (36.4 C)  Ht 5' 4.6" (1.641 m)  Wt 201 lb (91.173 kg)  BMI 33.86 kg/m2  SpO2 100%  Wt Readings from Last 3 Encounters:  10/19/15 201 lb (91.173 kg)  10/05/15 196 lb 9.6 oz (89.177 kg)  10/05/15 198 lb 6.4 oz (89.994 kg)    Physical Exam  Constitutional: She is oriented to person, place, and time. She appears well-developed and well-nourished. No distress.  HENT:  Head: Normocephalic and atraumatic.  Right Ear: Hearing normal.  Left Ear: Hearing normal.  Nose: Nose normal.  Eyes: Conjunctivae and lids are normal. Right eye exhibits no discharge. Left eye exhibits no discharge. No scleral icterus.  Cardiovascular: Normal rate, regular rhythm, normal heart sounds and intact distal pulses.  Exam reveals no gallop and no friction rub.   No murmur heard. Pulmonary/Chest: Effort normal and breath sounds normal. No respiratory distress. She has no wheezes. She has no rales. She exhibits no tenderness.  Abdominal: Soft. Bowel sounds are normal. She  exhibits no distension and no mass. There is no tenderness. There is no rebound and no guarding.  Musculoskeletal: Normal range of motion. She exhibits no edema or tenderness.  Neurological: She is alert and oriented to person, place, and time.  Skin: Skin is warm, dry and intact. No rash noted. She is not diaphoretic. No erythema. No pallor.  Psychiatric: She has a normal mood and affect. Her speech is normal and behavior is normal. Judgment and thought content normal. Cognition and memory are normal.  Nursing note and vitals  reviewed.   Results for orders placed or performed in visit on 10/05/15  Bayer DCA Hb A1c Waived  Result Value Ref Range   Bayer DCA Hb A1c Waived 8.0 (H) <7.0 %  CBC with Differential/Platelet  Result Value Ref Range   WBC 7.7 3.4 - 10.8 x10E3/uL   RBC 4.88 3.77 - 5.28 x10E6/uL   Hemoglobin 12.1 11.1 - 15.9 g/dL   Hematocrit 01.7 51.0 - 46.6 %   MCV 76 (L) 79 - 97 fL   MCH 24.8 (L) 26.6 - 33.0 pg   MCHC 32.6 31.5 - 35.7 g/dL   RDW 25.8 52.7 - 78.2 %   Platelets 276 150 - 379 x10E3/uL   Neutrophils 46 %   Lymphs 37 %   Monocytes 9 %   Eos 7 %   Basos 1 %   Neutrophils Absolute 3.6 1.4 - 7.0 x10E3/uL   Lymphocytes Absolute 2.8 0.7 - 3.1 x10E3/uL   Monocytes Absolute 0.7 0.1 - 0.9 x10E3/uL   EOS (ABSOLUTE) 0.6 (H) 0.0 - 0.4 x10E3/uL   Basophils Absolute 0.0 0.0 - 0.2 x10E3/uL   Immature Granulocytes 0 %   Immature Grans (Abs) 0.0 0.0 - 0.1 x10E3/uL  Comprehensive metabolic panel  Result Value Ref Range   Glucose 162 (H) 65 - 99 mg/dL   BUN 14 6 - 24 mg/dL   Creatinine, Ser 4.23 0.57 - 1.00 mg/dL   GFR calc non Af Amer 73 >59 mL/min/1.73   GFR calc Af Amer 84 >59 mL/min/1.73   BUN/Creatinine Ratio 16 9 - 23   Sodium 141 134 - 144 mmol/L   Potassium 4.4 3.5 - 5.2 mmol/L   Chloride 98 96 - 106 mmol/L   CO2 28 18 - 29 mmol/L   Calcium 10.5 (H) 8.7 - 10.2 mg/dL   Total Protein 7.6 6.0 - 8.5 g/dL   Albumin 4.5 3.5 - 5.5 g/dL   Globulin, Total 3.1 1.5 - 4.5 g/dL   Albumin/Globulin Ratio 1.5 1.1 - 2.5   Bilirubin Total 0.2 0.0 - 1.2 mg/dL   Alkaline Phosphatase 104 39 - 117 IU/L   AST 21 0 - 40 IU/L   ALT 23 0 - 32 IU/L  Lipid Panel w/o Chol/HDL Ratio  Result Value Ref Range   Cholesterol, Total 155 100 - 199 mg/dL   Triglycerides 536 (H) 0 - 149 mg/dL   HDL 32 (L) >14 mg/dL   VLDL Cholesterol Cal 47 (H) 5 - 40 mg/dL   LDL Calculated 76 0 - 99 mg/dL      Assessment & Plan:   Problem List Items Addressed This Visit      Cardiovascular and Mediastinum   HTN  (hypertension)    Didn't take her medicine. Restart medicine. Recheck next visit.         Digestive   GERD (gastroesophageal reflux disease) - Primary    Will start omprazole. If not better, will change to protonix. Recheck in 1 month.  If not better, and stomach issues still going on, will refer to GI for further evaluation.       Relevant Medications   omeprazole (PRILOSEC) 40 MG capsule     Endocrine   Type 2 diabetes mellitus (HCC)    Restart glipizide. Watch sugars. Likely cause of blurred vision.       Relevant Medications   glipiZIDE (GLUCOTROL XL) 10 MG 24 hr tablet     Other   Chest pain    Seems to be due to stress and GERD. Will try 1 month of PPI, if not better, referral to GI.        Other Visit Diagnoses    Peripheral edema        Rx for compression stockings given today. Continue to monitor. Call if not getting better or getting worse.         Follow up plan: Return in about 4 weeks (around 11/16/2015).

## 2015-10-19 NOTE — Assessment & Plan Note (Signed)
Will start omprazole. If not better, will change to protonix. Recheck in 1 month. If not better, and stomach issues still going on, will refer to GI for further evaluation.

## 2015-10-19 NOTE — Assessment & Plan Note (Signed)
Didn't take her medicine. Restart medicine. Recheck next visit.

## 2015-10-19 NOTE — Assessment & Plan Note (Signed)
Seems to be due to stress and GERD. Will try 1 month of PPI, if not better, referral to GI.

## 2015-11-08 ENCOUNTER — Ambulatory Visit: Payer: 59 | Admitting: Family Medicine

## 2015-11-09 ENCOUNTER — Telehealth: Payer: Self-pay

## 2015-11-09 MED ORDER — LISINOPRIL-HYDROCHLOROTHIAZIDE 20-25 MG PO TABS: 2.0000 | ORAL_TABLET | Freq: Every day | ORAL | Status: DC

## 2015-11-09 NOTE — Telephone Encounter (Signed)
[facility] is requesting a 90 day supply  Omeprazole 40mg  Lisinopril/ HCTZ

## 2015-11-09 NOTE — Telephone Encounter (Signed)
Lisinopril sent over. Needs to have a recheck on omeprazole before 90 day supply can be called in.

## 2015-11-22 ENCOUNTER — Ambulatory Visit (INDEPENDENT_AMBULATORY_CARE_PROVIDER_SITE_OTHER): Payer: 59 | Admitting: Family Medicine

## 2015-11-22 ENCOUNTER — Encounter: Payer: Self-pay | Admitting: Family Medicine

## 2015-11-22 VITALS — BP 159/90 | HR 54 | Temp 98.1°F | Ht 64.5 in | Wt 202.0 lb

## 2015-11-22 DIAGNOSIS — M545 Low back pain, unspecified: Secondary | ICD-10-CM

## 2015-11-22 DIAGNOSIS — G4733 Obstructive sleep apnea (adult) (pediatric): Secondary | ICD-10-CM

## 2015-11-22 DIAGNOSIS — K219 Gastro-esophageal reflux disease without esophagitis: Secondary | ICD-10-CM

## 2015-11-22 DIAGNOSIS — I10 Essential (primary) hypertension: Secondary | ICD-10-CM | POA: Diagnosis not present

## 2015-11-22 DIAGNOSIS — J301 Allergic rhinitis due to pollen: Secondary | ICD-10-CM | POA: Diagnosis not present

## 2015-11-22 DIAGNOSIS — J309 Allergic rhinitis, unspecified: Secondary | ICD-10-CM | POA: Insufficient documentation

## 2015-11-22 MED ORDER — OLOPATADINE HCL 0.2 % OP SOLN: 1.0000 [drp] | OPHTHALMIC | Status: DC | PRN

## 2015-11-22 MED ORDER — CLONIDINE HCL 0.2 MG PO TABS: 0.2000 mg | ORAL_TABLET | Freq: Two times a day (BID) | ORAL | Status: DC

## 2015-11-22 MED ORDER — TRIAMCINOLONE ACETONIDE 0.1 % EX CREA: 1.0000 "application " | TOPICAL_CREAM | CUTANEOUS | Status: DC | PRN

## 2015-11-22 MED ORDER — OMEPRAZOLE 40 MG PO CPDR: 40.0000 mg | DELAYED_RELEASE_CAPSULE | Freq: Every day | ORAL | Status: DC

## 2015-11-22 NOTE — Assessment & Plan Note (Signed)
Out of her medicine. Refills given today. Call if not getting better or getting worse.

## 2015-11-22 NOTE — Assessment & Plan Note (Signed)
No better after a month on PPI- will continue PPI for now and get her into see GI. Referral generated today.

## 2015-11-22 NOTE — Assessment & Plan Note (Signed)
Not under good control. Has poorly controlled OSA- will get her titrated on her cpap and will recheck after that. Continue current regimen for the time being.

## 2015-11-22 NOTE — Progress Notes (Signed)
BP 159/90 mmHg  Pulse 54  Temp(Src) 98.1 F (36.7 C)  Ht 5' 4.5" (1.638 m)  Wt 202 lb (91.627 kg)  BMI 34.15 kg/m2  SpO2 97%   Subjective:    Patient ID: April Bauer, female    DOB: 1955-09-26, 60 y.o.   MRN: [id number]  HPI: April Bauer is a 60 y.o. female  Chief Complaint  Patient presents with  . Gastroesophageal Reflux    [facility]  . Hypertension    Refill on Lisinopril/hctz  . Allergic Rhinitis     Refill on pataday and kenalog cream  . Medication Refill    Trulicity   ALLERGIES Duration: chronic Runny nose: yes  Nasal congestion: yes Nasal itching: yes Sneezing: yes Eye swelling, itching or discharge: yes Post nasal drip: yes Cough: yes Sinus pressure: yes  Ear pain: no  Ear pressure: no  Fever: no  Symptoms occur seasonally: yes Symptoms occur perenially: yes Satisfied with current treatment: no Allergist evaluation in past: no Allergen injection immunotherapy: no Recurrent sinus infections: no ENT evaluation in past: no Known environmental allergy: yes  HYPERTENSION Hypertension status: uncontrolled  Satisfied with current treatment? no Duration of hypertension: chronic BP monitoring frequency:  not checking BP medication side effects:  no Medication compliance: good compliance Aspirin: yes Recurrent headaches: no Visual changes: no Palpitations: no Dyspnea: no Chest pain: no Lower extremity edema: no Dizzy/lightheaded: no  GERD GERD control status: no better  Satisfied with current treatment? no Heartburn frequency: 2x a week Medication side effects: no  Medication compliance: excellent Antacid use frequency: 2x a week   Duration: 5-10 minutes Nature: burning Location: substernal Dysphagia: no Odynophagia:  no Hematemesis: no Blood in stool: no EGD: no  SLEEP APNEA Sleep apnea status: uncontrolled Duration: chronic Satisfied with current treatment?:  no CPAP use:  no Sleep quality with CPAP  use: Hasn't used it in several months Treament compliance:poor compliance Last sleep study:  Treatments attempted:  Wakes feeling refreshed:  no Daytime hypersomnolence:  yes Fatigue:  yes Insomnia:  no Good sleep hygiene:  no Difficulty falling asleep:  no Difficulty staying asleep:  no Snoring bothers bed partner:  yes Observed apnea by bed partner: yes Obesity:  yes Hypertension: yes  Pulmonary hypertension:  no Coronary artery disease:  yes   Relevant past medical, surgical, family and social history reviewed and updated as indicated. Interim medical history since our last visit reviewed. Allergies and medications reviewed and updated.  Review of Systems  Constitutional: Negative.   HENT: Positive for congestion, postnasal drip, rhinorrhea, sinus pressure, sneezing and sore throat. Negative for dental problem, drooling, ear discharge, ear pain, facial swelling, hearing loss, mouth sores, nosebleeds, tinnitus, trouble swallowing and voice change.   Respiratory: Negative.   Cardiovascular: Negative.   Gastrointestinal: Positive for abdominal pain. Negative for nausea, vomiting, diarrhea, constipation, blood in stool, abdominal distention, anal bleeding and rectal pain.  Musculoskeletal: Positive for back pain. Negative for myalgias, joint swelling, arthralgias, gait problem, neck pain and neck stiffness.  Skin: Negative.   Psychiatric/Behavioral: Negative.     Per HPI unless specifically indicated above     Objective:    BP 159/90 mmHg  Pulse 54  Temp(Src) 98.1 F (36.7 C)  Ht 5' 4.5" (1.638 m)  Wt 202 lb (91.627 kg)  BMI 34.15 kg/m2  SpO2 97%  Wt Readings from Last 3 Encounters:  11/22/15 202 lb (91.627 kg)  10/19/15 201 lb (91.173 kg)  10/05/15 196 lb 9.6  oz (89.177 kg)    Physical Exam  Constitutional: She is oriented to person, place, and time. She appears well-developed and well-nourished. No distress.  HENT:  Head: Normocephalic and atraumatic.  Right  Ear: Hearing, tympanic membrane, external ear and ear canal normal.  Left Ear: Hearing, tympanic membrane, external ear and ear canal normal.  Nose: Mucosal edema and rhinorrhea present.  Mouth/Throat: Uvula is midline, oropharynx is clear and moist and mucous membranes are normal. No oropharyngeal exudate.  Eyes: Conjunctivae, EOM and lids are normal. Pupils are equal, round, and reactive to light. Right eye exhibits no discharge. Left eye exhibits no discharge. No scleral icterus.  Neck: Normal range of motion. Neck supple. No JVD present. No tracheal deviation present. No thyromegaly present.  Cardiovascular: Normal rate, regular rhythm, normal heart sounds and intact distal pulses.  Exam reveals no gallop and no friction rub.   No murmur heard. Pulmonary/Chest: Effort normal and breath sounds normal. No stridor. No respiratory distress. She has no wheezes. She has no rales. She exhibits no tenderness.  Abdominal: Soft. Bowel sounds are normal. She exhibits no distension and no mass. There is no tenderness. There is no rebound and no guarding.  Musculoskeletal: Normal range of motion.  Lymphadenopathy:    She has cervical adenopathy.  Neurological: She is alert and oriented to person, place, and time.  Skin: Skin is warm, dry and intact. No rash noted. She is not diaphoretic. No erythema. No pallor.  Psychiatric: She has a normal mood and affect. Her speech is normal and behavior is normal. Judgment and thought content normal. Cognition and memory are normal.  Nursing note and vitals reviewed.   Results for orders placed or performed in visit on 10/05/15  Bayer DCA Hb A1c Waived  Result Value Ref Range   Bayer DCA Hb A1c Waived 8.0 (H) <7.0 %  CBC with Differential/Platelet  Result Value Ref Range   WBC 7.7 3.4 - 10.8 x10E3/uL   RBC 4.88 3.77 - 5.28 x10E6/uL   Hemoglobin 12.1 11.1 - 15.9 g/dL   Hematocrit 38.7 56.4 - 46.6 %   MCV 76 (L) 79 - 97 fL   MCH 24.8 (L) 26.6 - 33.0 pg    MCHC 32.6 31.5 - 35.7 g/dL   RDW 33.2 95.1 - 88.4 %   Platelets 276 150 - 379 x10E3/uL   Neutrophils 46 %   Lymphs 37 %   Monocytes 9 %   Eos 7 %   Basos 1 %   Neutrophils Absolute 3.6 1.4 - 7.0 x10E3/uL   Lymphocytes Absolute 2.8 0.7 - 3.1 x10E3/uL   Monocytes Absolute 0.7 0.1 - 0.9 x10E3/uL   EOS (ABSOLUTE) 0.6 (H) 0.0 - 0.4 x10E3/uL   Basophils Absolute 0.0 0.0 - 0.2 x10E3/uL   Immature Granulocytes 0 %   Immature Grans (Abs) 0.0 0.0 - 0.1 x10E3/uL  Comprehensive metabolic panel  Result Value Ref Range   Glucose 162 (H) 65 - 99 mg/dL   BUN 14 6 - 24 mg/dL   Creatinine, Ser 1.66 0.57 - 1.00 mg/dL   GFR calc non Af Amer 73 >59 mL/min/1.73   GFR calc Af Amer 84 >59 mL/min/1.73   BUN/Creatinine Ratio 16 9 - 23   Sodium 141 134 - 144 mmol/L   Potassium 4.4 3.5 - 5.2 mmol/L   Chloride 98 96 - 106 mmol/L   CO2 28 18 - 29 mmol/L   Calcium 10.5 (H) 8.7 - 10.2 mg/dL   Total Protein 7.6 6.0 - 8.5  g/dL   Albumin 4.5 3.5 - 5.5 g/dL   Globulin, Total 3.1 1.5 - 4.5 g/dL   Albumin/Globulin Ratio 1.5 1.1 - 2.5   Bilirubin Total 0.2 0.0 - 1.2 mg/dL   Alkaline Phosphatase 104 39 - 117 IU/L   AST 21 0 - 40 IU/L   ALT 23 0 - 32 IU/L  Lipid Panel w/o Chol/HDL Ratio  Result Value Ref Range   Cholesterol, Total 155 100 - 199 mg/dL   Triglycerides 297 (H) 0 - 149 mg/dL   HDL 32 (L) >98 mg/dL   VLDL Cholesterol Cal 47 (H) 5 - 40 mg/dL   LDL Calculated 76 0 - 99 mg/dL      Assessment & Plan:   Problem List Items Addressed This Visit      Cardiovascular and Mediastinum   HTN (hypertension)    Not under good control. Has poorly controlled OSA- will get her titrated on her cpap and will recheck after that. Continue current regimen for the time being.       Relevant Medications   cloNIDine (CATAPRES) 0.2 MG tablet     Respiratory   OSA (obstructive sleep apnea)    Not under good control. Hasn't been using her cpap in several months. Will get her retitrated on it, may help with GERD and  HTN.       Relevant Orders   Ambulatory referral to Sleep Studies   Allergic rhinitis    Out of her medicine. Refills given today. Call if not getting better or getting worse.         Digestive   GERD (gastroesophageal reflux disease) - Primary    No better after a month on PPI- will continue PPI for now and get her into see GI. Referral generated today.      Relevant Medications   omeprazole (PRILOSEC) 40 MG capsule   Other Relevant Orders   Ambulatory referral to General Surgery    Other Visit Diagnoses    Bilateral low back pain without sciatica        Will start exercises. Call for appointment to further evaluate if not doing better or doing worse.         Follow up plan: Return 2 months, for DM visit.

## 2015-11-22 NOTE — Patient Instructions (Signed)

## 2015-11-22 NOTE — Assessment & Plan Note (Signed)
Not under good control. Hasn't been using her cpap in several months. Will get her retitrated on it, may help with GERD and HTN.

## 2015-12-08 ENCOUNTER — Emergency Department: Payer: 59

## 2015-12-08 ENCOUNTER — Encounter: Payer: Self-pay | Admitting: Emergency Medicine

## 2015-12-08 DIAGNOSIS — J45909 Unspecified asthma, uncomplicated: Secondary | ICD-10-CM | POA: Diagnosis not present

## 2015-12-08 DIAGNOSIS — I10 Essential (primary) hypertension: Secondary | ICD-10-CM | POA: Insufficient documentation

## 2015-12-08 DIAGNOSIS — J209 Acute bronchitis, unspecified: Secondary | ICD-10-CM | POA: Diagnosis not present

## 2015-12-08 DIAGNOSIS — Z7984 Long term (current) use of oral hypoglycemic drugs: Secondary | ICD-10-CM | POA: Insufficient documentation

## 2015-12-08 DIAGNOSIS — E785 Hyperlipidemia, unspecified: Secondary | ICD-10-CM | POA: Diagnosis not present

## 2015-12-08 DIAGNOSIS — Z7982 Long term (current) use of aspirin: Secondary | ICD-10-CM | POA: Diagnosis not present

## 2015-12-08 DIAGNOSIS — Z794 Long term (current) use of insulin: Secondary | ICD-10-CM | POA: Diagnosis not present

## 2015-12-08 DIAGNOSIS — R0602 Shortness of breath: Secondary | ICD-10-CM | POA: Diagnosis present

## 2015-12-08 DIAGNOSIS — E119 Type 2 diabetes mellitus without complications: Secondary | ICD-10-CM | POA: Insufficient documentation

## 2015-12-08 DIAGNOSIS — Z79899 Other long term (current) drug therapy: Secondary | ICD-10-CM | POA: Diagnosis not present

## 2015-12-08 LAB — BASIC METABOLIC PANEL
Anion gap: 12 (ref 5–15)
BUN: 19 mg/dL (ref 6–20)
CO2: 24 mmol/L (ref 22–32)
Calcium: 9.1 mg/dL (ref 8.9–10.3)
Chloride: 102 mmol/L (ref 101–111)
Creatinine, Ser: 1.39 mg/dL — ABNORMAL HIGH (ref 0.44–1.00)
GFR calc Af Amer: 47 mL/min — ABNORMAL LOW (ref >60)
GFR calc non Af Amer: 41 mL/min — ABNORMAL LOW (ref >60)
Glucose, Bld: 192 mg/dL — ABNORMAL HIGH (ref 65–99)
Potassium: 3.1 mmol/L — ABNORMAL LOW (ref 3.5–5.1)
Sodium: 138 mmol/L (ref 135–145)

## 2015-12-08 LAB — CBC WITH DIFFERENTIAL/PLATELET
Basophils Absolute: 0.1 10*3/uL (ref 0–0.1)
Basophils Relative: 1 %
Eosinophils Absolute: 0.1 10*3/uL (ref 0–0.7)
Eosinophils Relative: 2 %
HCT: 36.9 % (ref 35.0–47.0)
Hemoglobin: 12.1 g/dL (ref 12.0–16.0)
Lymphocytes Relative: 27 %
Lymphs Abs: 1.7 10*3/uL (ref 1.0–3.6)
MCH: 25.3 pg — ABNORMAL LOW (ref 26.0–34.0)
MCHC: 32.7 g/dL (ref 32.0–36.0)
MCV: 77.4 fL — ABNORMAL LOW (ref 80.0–100.0)
Monocytes Absolute: 0.8 10*3/uL (ref 0.2–0.9)
Monocytes Relative: 12 %
Neutro Abs: 3.7 10*3/uL (ref 1.4–6.5)
Neutrophils Relative %: 58 %
Platelets: 181 10*3/uL (ref 150–440)
RBC: 4.77 MIL/uL (ref 3.80–5.20)
RDW: 13.7 % (ref 11.5–14.5)
WBC: 6.5 10*3/uL (ref 3.6–11.0)

## 2015-12-08 LAB — URINALYSIS COMPLETE WITH MICROSCOPIC (ARMC ONLY)
Bilirubin Urine: NEGATIVE
Glucose, UA: NEGATIVE mg/dL
Hgb urine dipstick: NEGATIVE
Nitrite: NEGATIVE
Protein, ur: 30 mg/dL — AB
Specific Gravity, Urine: 1.023 (ref 1.005–1.030)
pH: 5 (ref 5.0–8.0)

## 2015-12-08 LAB — TROPONIN I: Troponin I: <0.03 ng/mL (ref <0.031)

## 2015-12-08 MED ORDER — ALBUTEROL SULFATE (2.5 MG/3ML) 0.083% IN NEBU
5.0000 mg | INHALATION_SOLUTION | Freq: Once | RESPIRATORY_TRACT | Status: DC
  Filled 2015-12-08: qty 6

## 2015-12-08 NOTE — ED Notes (Signed)
Pt presents to ED with c/o SOB, generalize body ache, chills and nausea- no vomiting x6 days. Pt denies fever.

## 2015-12-09 ENCOUNTER — Emergency Department
Admission: EM | Admit: 2015-12-09 | Discharge: 2015-12-09 | Disposition: A | Discharge status: Home / Self Care | Payer: 59 | Attending: Emergency Medicine | Admitting: Emergency Medicine

## 2015-12-09 DIAGNOSIS — J209 Acute bronchitis, unspecified: Secondary | ICD-10-CM

## 2015-12-09 LAB — RAPID INFLUENZA A&B ANTIGENS
Influenza A (ARMC): NEGATIVE
Influenza B (ARMC): NEGATIVE

## 2015-12-09 MED ORDER — AZITHROMYCIN 500 MG PO TABS
500.0000 mg | ORAL_TABLET | Freq: Once | ORAL | Status: AC
  Administered 2015-12-09: 500 mg via ORAL
  Filled 2015-12-09: qty 1

## 2015-12-09 MED ORDER — SODIUM CHLORIDE 0.9 % IV BOLUS (SEPSIS)
1000.0000 mL | Freq: Once | INTRAVENOUS | Status: AC
  Administered 2015-12-09: 1000 mL via INTRAVENOUS

## 2015-12-09 MED ORDER — AZITHROMYCIN 250 MG PO TABS: ORAL_TABLET | ORAL | Status: AC

## 2015-12-09 NOTE — ED Provider Notes (Signed)
[facility] Emergency Department Provider Note  Time seen: 12:55 AM  I have reviewed the triage vital signs and the nursing notes.   HISTORY  Chief Complaint Shortness of Breath and Generalized Body Aches    HPI April Bauer is a 60 y.o. female with a past medical history of hypertension, hyperlipidemia, asthma, diabetes presents to the emergency department with 5 days of cough, congestion and shortness of breath. According to the patient for the past 5 days she has been having a cough productive of yellow sputum. She has been feeling short of breath with her cough. States she is taking DayQuil for symptoms but it is not helping with her generalized body aches. Describes her body aches as moderate. Describes her cough as moderate productive of yellow sputum. Denies any chest pain. States she was at work [employer] and became dizzy after a coughing spell as well as some mild nausea so she came to the emergency department for evaluation.     Past Medical History  Diagnosis Date  . Hyperlipidemia   . Hypertension   . Diabetes mellitus without complication (HCC)   . Anxiety   . Asthma   . RA (rheumatoid arthritis) [facility])     Patient Active Problem List   Diagnosis Date Noted  . OSA (obstructive sleep apnea) 11/22/2015  . Allergic rhinitis 11/22/2015  . GERD (gastroesophageal reflux disease) 10/19/2015  . Chest pain 09/30/2015  . Hyperlipemia   . HTN (hypertension)   . Type 2 diabetes mellitus (HCC)   . Anxiety     Past Surgical History  Procedure Laterality Date  . Cholecystectomy    . Rotary cuff    . Tubal ligation    . Cardiac catherization    . Lithotripsy      kidney stones    Current Outpatient Rx  Name  Route  Sig  Dispense  Refill  . aspirin EC 81 MG tablet   Oral   Take 81 mg by mouth daily.         . beclomethasone (QVAR) 40 MCG/ACT inhaler   Inhalation   Inhale 2 puffs into the lungs 2 (two) times daily as needed (for shortness of  breath/wheezing).         . cloNIDine (CATAPRES) 0.2 MG tablet   Oral   Take 1 tablet (0.2 mg total) by mouth 2 (two) times daily.   60 tablet   11   . cyclobenzaprine (FLEXERIL) 10 MG tablet   Oral   Take 1 tablet (10 mg total) by mouth 3 (three) times daily as needed for muscle spasms. Patient taking differently: Take 10 mg by mouth 3 (three) times daily.    90 tablet   0   . Dulaglutide (TRULICITY) 0.75 MG/0.5ML SOPN   Subcutaneous   Inject 0.75 mg into the skin once a week.   4 pen   3   . fluticasone (FLONASE) 50 MCG/ACT nasal spray   Each Nare   Place 2 sprays into both nostrils daily. Patient taking differently: Place 2 sprays into both nostrils daily as needed for rhinitis.    16 g   6   . glipiZIDE (GLUCOTROL XL) 10 MG 24 hr tablet   Oral   Take 1 tablet (10 mg total) by mouth daily with breakfast.   90 tablet   1   . Insulin Glargine (TOUJEO SOLOSTAR) 300 UNIT/ML SOPN   Subcutaneous   Inject 25 Units into the skin daily.   3 pen  3   . lisinopril-hydrochlorothiazide (PRINZIDE,ZESTORETIC) 20-25 MG tablet   Oral   Take 2 tablets by mouth daily.   180 tablet   1   . meloxicam (MOBIC) 15 MG tablet   Oral   Take 1 tablet (15 mg total) by mouth daily.   30 tablet   3   . metFORMIN (GLUCOPHAGE) 850 MG tablet   Oral   Take 1 tablet (850 mg total) by mouth 2 (two) times daily with a meal.   180 tablet   1   . nitroGLYCERIN (NITROSTAT) 0.4 MG SL tablet   Sublingual   Place 1 tablet (0.4 mg total) under the tongue every 5 (five) minutes as needed for chest pain.   30 tablet   12   . Olopatadine HCl (PATADAY) 0.2 % SOLN   Both Eyes   Place 1 drop into both eyes as needed (for allergies).   2.5 mL   12   . omeprazole (PRILOSEC) 40 MG capsule   Oral   Take 1 capsule (40 mg total) by mouth daily.   90 capsule   1   . simvastatin (ZOCOR) 20 MG tablet   Oral   Take 1 tablet (20 mg total) by mouth daily.   90 tablet   1   . triamcinolone  cream (KENALOG) 0.1 %   Topical   Apply 1 application topically as needed (for itching).   30 g   0     Allergies Eggs or egg-derived products; Tramadol; and Vicodin  Family History  Problem Relation Age of Onset  . Cancer Mother     Pancreatic  . Diabetes Sister   . Hypertension Sister   . Diabetes Brother   . Hypertension Brother   . Liver disease Brother     Social History Social History  Substance Use Topics  . Smoking status: Never Smoker   . Smokeless tobacco: Never Used  . Alcohol Use: No    Review of Systems Constitutional: Negative for fever. Cardiovascular: Negative for chest pain. Respiratory: Mild shortness breath. Positive for cough productive of yellow sputum. Gastrointestinal: Negative for abdominal pain. Positive for nausea but denies vomiting or diarrhea. Neurological: Negative for headache 10-point ROS otherwise negative.  ____________________________________________   PHYSICAL EXAM:  VITAL SIGNS: ED Triage Vitals  Enc Vitals Group     BP 12/08/15 2142 170/96 mmHg     Pulse Rate 12/08/15 2142 78     Resp 12/08/15 2142 18     Temp 12/08/15 2142 98.6 F (37 C)     Temp Source 12/08/15 2142 Oral     SpO2 12/08/15 2142 99 %     Weight 12/08/15 2144 199 lb 9.6 oz (90.538 kg)     Height 12/08/15 2144 5\' 6"  (1.676 m)     Head Cir --      Peak Flow --      Pain Score 12/08/15 2143 9     Pain Loc --      Pain Edu? --      Excl. in GC? --     Constitutional: Alert and oriented. Well appearing and in no distress. Eyes: Normal exam ENT   Head: Normocephalic and atraumatic.   Mouth/Throat: Mucous membranes are moist. Cardiovascular: Normal rate, regular rhythm. No murmur Respiratory: Normal respiratory effort without tachypnea nor retractions. Breath sounds are clear and equal bilaterally. No wheezes/rales/rhonchi. Gastrointestinal: Soft and nontender. No distention. Musculoskeletal: Nontender with normal range of motion in all  extremities. Neurologic:  Normal  speech and language. No gross focal neurologic deficits Skin:  Skin is warm, dry and intact.  Psychiatric: Mood and affect are normal. Speech and behavior are normal.   ____________________________________________    EKG  EKG reviewed and interpreted by myself shows normal sinus rhythm at 71 bpm, narrow QRS, left axis deviation, nonspecific ST changes including T-wave inversions in inferolateral leads. Largely unchanged from 09/30/15.  ____________________________________________    RADIOLOGY  Chest x-ray negative.   INITIAL IMPRESSION / ASSESSMENT AND PLAN / ED COURSE  Pertinent labs & imaging results that were available during my care of the patient were reviewed by me and considered in my medical decision making (see chart for details).  Patient presents to the emergency department with 5-6 days of cough, congestion, yellow sputum production and shortness of breath. Patient has a normal physical exam, frequent cough during exam. Chest x-ray is negative. Labs show mild renal insufficiency otherwise within normal limits. We will IV hydrate the patient in the emergency department. Given 6 days of cough with sputum production and a negative x-ray we'll place the patient on Zithromax to cover for acute bronchitis. Overall the patient appears quite well.  ____________________________________________   FINAL CLINICAL IMPRESSION(S) / ED DIAGNOSES  Bronchitis   Minna Antis, MD 12/09/15 [phone]

## 2015-12-09 NOTE — Discharge Instructions (Signed)

## 2015-12-20 ENCOUNTER — Other Ambulatory Visit: Payer: Self-pay | Admitting: Family Medicine

## 2016-01-06 ENCOUNTER — Other Ambulatory Visit: Payer: Self-pay | Admitting: Family Medicine

## 2016-01-11 ENCOUNTER — Other Ambulatory Visit: Payer: Self-pay

## 2016-01-11 MED ORDER — DULAGLUTIDE 0.75 MG/0.5ML ~~LOC~~ SOAJ: 0.7500 mg | SUBCUTANEOUS | Status: DC

## 2016-01-11 NOTE — Telephone Encounter (Signed)
Routing to provider  

## 2016-01-15 ENCOUNTER — Other Ambulatory Visit: Payer: Self-pay | Admitting: Family Medicine

## 2016-01-16 NOTE — Telephone Encounter (Signed)
6 month supply given in February. Should not be due until August.

## 2016-01-16 NOTE — Telephone Encounter (Signed)
Tried reaching out to patient.  No answer and no mailbox set up yet. Will try again later.

## 2016-01-17 ENCOUNTER — Other Ambulatory Visit: Payer: Self-pay | Admitting: Family Medicine

## 2016-01-17 NOTE — Telephone Encounter (Signed)
Called and got no answer but got to leave a message. Asked patient to return my phone call.

## 2016-01-30 ENCOUNTER — Other Ambulatory Visit: Payer: Self-pay

## 2016-01-30 DIAGNOSIS — E118 Type 2 diabetes mellitus with unspecified complications: Secondary | ICD-10-CM

## 2016-01-31 ENCOUNTER — Ambulatory Visit: Payer: 59 | Admitting: Family Medicine

## 2016-02-09 ENCOUNTER — Ambulatory Visit: Payer: 59

## 2016-02-10 ENCOUNTER — Ambulatory Visit (INDEPENDENT_AMBULATORY_CARE_PROVIDER_SITE_OTHER): Payer: 59 | Admitting: Family Medicine

## 2016-02-10 ENCOUNTER — Encounter: Payer: Self-pay | Admitting: Family Medicine

## 2016-02-10 VITALS — BP 194/80 | HR 62 | Temp 98.8°F | Ht 64.6 in | Wt 204.0 lb

## 2016-02-10 DIAGNOSIS — I10 Essential (primary) hypertension: Secondary | ICD-10-CM | POA: Diagnosis not present

## 2016-02-10 DIAGNOSIS — E118 Type 2 diabetes mellitus with unspecified complications: Secondary | ICD-10-CM | POA: Diagnosis not present

## 2016-02-10 LAB — BAYER DCA HB A1C WAIVED: HB A1C (BAYER DCA - WAIVED): 8.1 % — ABNORMAL HIGH (ref <7.0)

## 2016-02-10 MED ORDER — LISINOPRIL-HYDROCHLOROTHIAZIDE 20-25 MG PO TABS: 2.0000 | ORAL_TABLET | Freq: Every day | ORAL | Status: DC

## 2016-02-10 MED ORDER — INSULIN GLARGINE 300 UNIT/ML ~~LOC~~ SOPN: 27.0000 [IU] | PEN_INJECTOR | Freq: Every day | SUBCUTANEOUS | Status: DC

## 2016-02-10 NOTE — Assessment & Plan Note (Signed)
A1c increased to 8.1. Will increase her tuejeo to 27 units nightly and check in by phone in 1 week.

## 2016-02-10 NOTE — Assessment & Plan Note (Signed)
Out of her medication for a week. Will restart and will recheck in 1 month. Still not on CPAP.

## 2016-02-10 NOTE — Progress Notes (Signed)
BP 194/80 mmHg  Pulse 62  Temp(Src) 98.8 F (37.1 C)  Ht 5' 4.6" (1.641 m)  Wt 204 lb (92.534 kg)  BMI 34.36 kg/m2  SpO2 99%   Subjective:    Patient ID: April Bauer, female    DOB: 1956/07/22, 60 y.o.   MRN: [id number]  HPI: April Bauer is a 60 y.o. female  Chief Complaint  Patient presents with  . Diabetes  . Hypertension    Patient would like a 90 day [facility]   DIABETES Hypoglycemic episodes:no Polydipsia/polyuria: no Visual disturbance: no Chest pain: no Paresthesias: no Glucose Monitoring: yes  Accucheck frequency: Daily  Fasting glucose: 170 and less Taking Insulin?: yes Blood Pressure Monitoring: not checking Retinal Examination: Up to Date Foot Exam: Up to Date Diabetic Education: Not Completed Pneumovax: Refused Influenza: Refused Aspirin: yes   HYPERTENSION- has been out of her medicine for about a week.  Hypertension status: exacerbated  Satisfied with current treatment? yes Duration of hypertension: chronic BP monitoring frequency:  not checking BP medication side effects:  no Medication compliance: fair compliance Aspirin: yes Recurrent headaches: no Visual changes: no Palpitations: no Dyspnea: no Chest pain: no Lower extremity edema: no Dizzy/lightheaded: no  Relevant past medical, surgical, family and social history reviewed and updated as indicated. Interim medical history since our last visit reviewed. Allergies and medications reviewed and updated.  Review of Systems  Constitutional: Negative.   Respiratory: Negative.   Cardiovascular: Negative.   Psychiatric/Behavioral: Negative.     Per HPI unless specifically indicated above     Objective:    BP 194/80 mmHg  Pulse 62  Temp(Src) 98.8 F (37.1 C)  Ht 5' 4.6" (1.641 m)  Wt 204 lb (92.534 kg)  BMI 34.36 kg/m2  SpO2 99%  Wt Readings from Last 3 Encounters:  02/10/16 204 lb (92.534 kg)  12/08/15 199 lb 9.6 oz (90.538 kg)  11/22/15 202 lb  (91.627 kg)    Physical Exam  Constitutional: She is oriented to person, place, and time. She appears well-developed and well-nourished. No distress.  HENT:  Head: Normocephalic and atraumatic.  Right Ear: Hearing normal.  Left Ear: Hearing normal.  Nose: Nose normal.  Eyes: Conjunctivae and lids are normal. Right eye exhibits no discharge. Left eye exhibits no discharge. No scleral icterus.  Pulmonary/Chest: Effort normal. No respiratory distress.  Musculoskeletal: Normal range of motion.  Neurological: She is alert and oriented to person, place, and time.  Skin: Skin is warm, dry and intact. No rash noted. No erythema. No pallor.  Psychiatric: She has a normal mood and affect. Her speech is normal and behavior is normal. Judgment and thought content normal. Cognition and memory are normal.  Nursing note and vitals reviewed.      Assessment & Plan:   Problem List Items Addressed This Visit      Cardiovascular and Mediastinum   HTN (hypertension)    Out of her medication for a week. Will restart and will recheck in 1 month. Still not on CPAP.       Relevant Medications   lisinopril-hydrochlorothiazide (PRINZIDE,ZESTORETIC) 20-25 MG tablet     Endocrine   Type 2 diabetes mellitus (HCC) - Primary    A1c increased to 8.1. Will increase her tuejeo to 27 units nightly and check in by phone in 1 week.      Relevant Medications   lisinopril-hydrochlorothiazide (PRINZIDE,ZESTORETIC) 20-25 MG tablet   Insulin Glargine (TOUJEO SOLOSTAR) 300 UNIT/ML SOPN  Follow up plan: Return in about 4 weeks (around 03/09/2016).

## 2016-02-17 ENCOUNTER — Telehealth: Payer: Self-pay | Admitting: Family Medicine

## 2016-02-17 NOTE — Telephone Encounter (Signed)
-----   Message from Dorcas Carrow, DO sent at 02/10/2016  8:40 AM EDT ----- Call about FBS

## 2016-02-17 NOTE — Telephone Encounter (Signed)
LMOM for her to call back. Would like to know where her fasting blood sugars are so we can adjust her medicine. Will wait on her call back.

## 2016-02-20 MED ORDER — INSULIN GLARGINE 300 UNIT/ML ~~LOC~~ SOPN: 30.0000 [IU] | PEN_INJECTOR | Freq: Every day | SUBCUTANEOUS | Status: DC

## 2016-02-20 NOTE — Telephone Encounter (Signed)
Called patient. She states that her fasting BS have been running around 170. Will increase toujeo to 30 units daily and recheck by phone in about a week.

## 2016-02-27 ENCOUNTER — Telehealth: Payer: Self-pay | Admitting: Family Medicine

## 2016-02-27 NOTE — Telephone Encounter (Signed)
-----   Message from Dorcas Carrow, DO sent at 02/20/2016 12:32 PM EDT ----- Call to check on Fasting blood sugars to titrate toujeo

## 2016-02-27 NOTE — Telephone Encounter (Signed)
Called to find out her blood sugars and titrate her toujeo. She will call back.

## 2016-03-02 NOTE — Telephone Encounter (Signed)
Sugars running 100-140. Will stay on current dose and check in again at her follow up.

## 2016-03-21 ENCOUNTER — Ambulatory Visit (INDEPENDENT_AMBULATORY_CARE_PROVIDER_SITE_OTHER): Payer: 59 | Admitting: Family Medicine

## 2016-03-21 ENCOUNTER — Encounter: Payer: Self-pay | Admitting: Family Medicine

## 2016-03-21 VITALS — BP 156/84 | HR 58 | Temp 97.8°F | Wt 204.0 lb

## 2016-03-21 DIAGNOSIS — E118 Type 2 diabetes mellitus with unspecified complications: Secondary | ICD-10-CM | POA: Diagnosis not present

## 2016-03-21 DIAGNOSIS — I10 Essential (primary) hypertension: Secondary | ICD-10-CM

## 2016-03-21 MED ORDER — AMLODIPINE BESYLATE 2.5 MG PO TABS: 2.5000 mg | ORAL_TABLET | Freq: Every day | ORAL | 3 refills | Status: DC

## 2016-03-21 NOTE — Assessment & Plan Note (Signed)
Still not under great control. Will add amlodipine, which she has been on before and recheck at DM visit in 2 months. Call with any problems.

## 2016-03-21 NOTE — Progress Notes (Signed)
BP (!) 156/84   Pulse (!) 58   Temp 97.8 F (36.6 C)   Wt 204 lb (92.5 kg)   SpO2 97%   BMI 34.37 kg/m    Subjective:    Patient ID: April Bauer, female    DOB: 1956/08/22, 60 y.o.   MRN: [id number]  HPI: April Bauer is a 60 y.o. female  Chief Complaint  Patient presents with  . Hypertension   HYPERTENSION Hypertension status: better  Satisfied with current treatment? yes Duration of hypertension: chronic BP monitoring frequency:  not checking BP medication side effects:  no Medication compliance: fair compliance Aspirin: yes Recurrent headaches: no Visual changes: no Palpitations: no Dyspnea: no Chest pain: no Lower extremity edema: no Dizzy/lightheaded: no  DIABETES Hypoglycemic episodes:no Polydipsia/polyuria: no Visual disturbance: no Chest pain: no Paresthesias: no Glucose Monitoring: yes  Accucheck frequency: Daily  Fasting glucose: 140-180 Taking Insulin?: yes Blood Pressure Monitoring: not checking Retinal Examination: Up to Date Foot Exam: Up to Date Diabetic Education: Completed Pneumovax: Refused Influenza: Allergic to eggs Aspirin: yes   Relevant past medical, surgical, family and social history reviewed and updated as indicated. Interim medical history since our last visit reviewed. Allergies and medications reviewed and updated.  Review of Systems  Constitutional: Negative.   Respiratory: Negative.   Cardiovascular: Negative.   Psychiatric/Behavioral: Negative.     Per HPI unless specifically indicated above     Objective:    BP (!) 156/84   Pulse (!) 58   Temp 97.8 F (36.6 C)   Wt 204 lb (92.5 kg)   SpO2 97%   BMI 34.37 kg/m   Wt Readings from Last 3 Encounters:  03/21/16 204 lb (92.5 kg)  02/10/16 204 lb (92.5 kg)  12/08/15 199 lb 9.6 oz (90.5 kg)    Physical Exam  Constitutional: She is oriented to person, place, and time. She appears well-developed and well-nourished. No distress.  HENT:  Head: Normocephalic and  atraumatic.  Right Ear: Hearing normal.  Left Ear: Hearing normal.  Nose: Nose normal.  Eyes: Conjunctivae and lids are normal. Right eye exhibits no discharge. Left eye exhibits no discharge. No scleral icterus.  Cardiovascular: Normal rate, regular rhythm, normal heart sounds and intact distal pulses.  Exam reveals no gallop and no friction rub.   No murmur heard. Pulmonary/Chest: Effort normal and breath sounds normal. No respiratory distress. She has no wheezes. She has no rales. She exhibits no tenderness.  Musculoskeletal: Normal range of motion.  Neurological: She is alert and oriented to person, place, and time.  Skin: Skin is warm, dry and intact. No rash noted. She is not diaphoretic. No erythema. No pallor.  Psychiatric: She has a normal mood and affect. Her speech is normal and behavior is normal. Judgment and thought content normal. Cognition and memory are normal.  Nursing note and vitals reviewed.   Results for orders placed or performed in visit on 02/10/16  Bayer DCA Hb A1c Waived  Result Value Ref Range   Bayer DCA Hb A1c Waived 8.1 (H) <7.0 %      Assessment & Plan:   Problem List Items Addressed This Visit      Cardiovascular and Mediastinum   HTN (hypertension)    Still not under great control. Will add amlodipine, which she has been on before and recheck at DM visit in 2 months. Call with any problems.       Relevant Medications   amLODipine (NORVASC) 2.5 MG tablet     Endocrine  Type 2 diabetes mellitus (HCC) - Primary    Continue current regimen. Due for A1c in 2 months. May increase tujeo after that.        Other Visit Diagnoses   None.      Follow up plan: Return in about 2 months (around 05/22/2016) for DM and BP visit.

## 2016-03-21 NOTE — Assessment & Plan Note (Signed)
Continue current regimen. Due for A1c in 2 months. May increase tujeo after that.

## 2016-04-09 ENCOUNTER — Other Ambulatory Visit: Payer: Self-pay

## 2016-04-09 ENCOUNTER — Other Ambulatory Visit: Payer: Self-pay | Admitting: Family Medicine

## 2016-04-11 ENCOUNTER — Other Ambulatory Visit: Payer: Self-pay | Admitting: Family Medicine

## 2016-04-11 ENCOUNTER — Other Ambulatory Visit: Payer: Self-pay | Admitting: Unknown Physician Specialty

## 2016-04-12 NOTE — Telephone Encounter (Signed)
Your patient 

## 2016-04-12 NOTE — Discharge Instructions (Signed)

## 2016-04-16 ENCOUNTER — Ambulatory Visit
Admission: RE | Admit: 2016-04-16 | Discharge: 2016-04-16 | Disposition: A | Discharge status: Home / Self Care | Payer: 59 | Source: Ambulatory Visit | Attending: Gastroenterology | Admitting: Gastroenterology

## 2016-04-16 ENCOUNTER — Telehealth: Payer: Self-pay | Admitting: Gastroenterology

## 2016-04-16 ENCOUNTER — Ambulatory Visit: Payer: 59 | Admitting: Anesthesiology

## 2016-04-16 ENCOUNTER — Encounter
Admission: RE | Disposition: A | Discharge status: Home / Self Care | Payer: Self-pay | Source: Ambulatory Visit | Attending: Gastroenterology

## 2016-04-16 ENCOUNTER — Other Ambulatory Visit: Payer: Self-pay | Admitting: Family Medicine

## 2016-04-16 ENCOUNTER — Other Ambulatory Visit: Payer: Self-pay

## 2016-04-16 DIAGNOSIS — M069 Rheumatoid arthritis, unspecified: Secondary | ICD-10-CM | POA: Insufficient documentation

## 2016-04-16 DIAGNOSIS — Z8379 Family history of other diseases of the digestive system: Secondary | ICD-10-CM | POA: Insufficient documentation

## 2016-04-16 DIAGNOSIS — R140 Abdominal distension (gaseous): Secondary | ICD-10-CM

## 2016-04-16 DIAGNOSIS — Z91012 Allergy to eggs: Secondary | ICD-10-CM | POA: Insufficient documentation

## 2016-04-16 DIAGNOSIS — E119 Type 2 diabetes mellitus without complications: Secondary | ICD-10-CM | POA: Insufficient documentation

## 2016-04-16 DIAGNOSIS — Z7982 Long term (current) use of aspirin: Secondary | ICD-10-CM | POA: Insufficient documentation

## 2016-04-16 DIAGNOSIS — Z9049 Acquired absence of other specified parts of digestive tract: Secondary | ICD-10-CM | POA: Insufficient documentation

## 2016-04-16 DIAGNOSIS — K219 Gastro-esophageal reflux disease without esophagitis: Secondary | ICD-10-CM | POA: Insufficient documentation

## 2016-04-16 DIAGNOSIS — Z833 Family history of diabetes mellitus: Secondary | ICD-10-CM | POA: Insufficient documentation

## 2016-04-16 DIAGNOSIS — I10 Essential (primary) hypertension: Secondary | ICD-10-CM | POA: Insufficient documentation

## 2016-04-16 DIAGNOSIS — R1013 Epigastric pain: Secondary | ICD-10-CM | POA: Diagnosis not present

## 2016-04-16 DIAGNOSIS — Z8249 Family history of ischemic heart disease and other diseases of the circulatory system: Secondary | ICD-10-CM | POA: Insufficient documentation

## 2016-04-16 DIAGNOSIS — E785 Hyperlipidemia, unspecified: Secondary | ICD-10-CM | POA: Insufficient documentation

## 2016-04-16 DIAGNOSIS — F419 Anxiety disorder, unspecified: Secondary | ICD-10-CM | POA: Insufficient documentation

## 2016-04-16 DIAGNOSIS — Z800 Family history of malignant neoplasm of digestive organs: Secondary | ICD-10-CM | POA: Insufficient documentation

## 2016-04-16 DIAGNOSIS — Z888 Allergy status to other drugs, medicaments and biological substances status: Secondary | ICD-10-CM | POA: Insufficient documentation

## 2016-04-16 DIAGNOSIS — Z885 Allergy status to narcotic agent status: Secondary | ICD-10-CM | POA: Insufficient documentation

## 2016-04-16 DIAGNOSIS — J45909 Unspecified asthma, uncomplicated: Secondary | ICD-10-CM | POA: Insufficient documentation

## 2016-04-16 DIAGNOSIS — Z87442 Personal history of urinary calculi: Secondary | ICD-10-CM | POA: Insufficient documentation

## 2016-04-16 DIAGNOSIS — G4730 Sleep apnea, unspecified: Secondary | ICD-10-CM | POA: Insufficient documentation

## 2016-04-16 DIAGNOSIS — Z6835 Body mass index (BMI) 35.0-35.9, adult: Secondary | ICD-10-CM | POA: Insufficient documentation

## 2016-04-16 DIAGNOSIS — Z79899 Other long term (current) drug therapy: Secondary | ICD-10-CM | POA: Insufficient documentation

## 2016-04-16 DIAGNOSIS — Z794 Long term (current) use of insulin: Secondary | ICD-10-CM | POA: Insufficient documentation

## 2016-04-16 HISTORY — DX: Headache: R51

## 2016-04-16 HISTORY — DX: Gastro-esophageal reflux disease without esophagitis: K21.9

## 2016-04-16 HISTORY — PX: ESOPHAGOGASTRODUODENOSCOPY (EGD) WITH PROPOFOL: SHX5813

## 2016-04-16 HISTORY — DX: Headache, unspecified: R51.9

## 2016-04-16 LAB — GLUCOSE, CAPILLARY
Glucose-Capillary: 116 mg/dL — ABNORMAL HIGH (ref 65–99)
Glucose-Capillary: 117 mg/dL — ABNORMAL HIGH (ref 65–99)

## 2016-04-16 SURGERY — ESOPHAGOGASTRODUODENOSCOPY (EGD) WITH PROPOFOL
Anesthesia: Monitor Anesthesia Care | Wound class: Clean Contaminated

## 2016-04-16 MED ORDER — PROPOFOL 10 MG/ML IV BOLUS
INTRAVENOUS | Status: DC | PRN
  Administered 2016-04-16: 20 mg via INTRAVENOUS
  Administered 2016-04-16: 50 mg via INTRAVENOUS
  Administered 2016-04-16 (×3): 20 mg via INTRAVENOUS

## 2016-04-16 MED ORDER — LIDOCAINE HCL (CARDIAC) 20 MG/ML IV SOLN
INTRAVENOUS | Status: DC | PRN
  Administered 2016-04-16: 50 mg via INTRAVENOUS

## 2016-04-16 MED ORDER — LACTATED RINGERS IV SOLN
INTRAVENOUS | Status: DC
  Administered 2016-04-16: 08:00:00 via INTRAVENOUS

## 2016-04-16 MED ORDER — LABETALOL HCL 5 MG/ML IV SOLN
5.0000 mg | INTRAVENOUS | Status: DC | PRN
  Administered 2016-04-16 (×3): 5 mg via INTRAVENOUS

## 2016-04-16 SURGICAL SUPPLY — 32 items
BALLN DILATOR 10-12 8 (BALLOONS)
BALLN DILATOR 12-15 8 (BALLOONS)
BALLN DILATOR 15-18 8 (BALLOONS)
BALLN DILATOR CRE 0-12 8 (BALLOONS)
BALLN DILATOR ESOPH 8 10 CRE (MISCELLANEOUS) IMPLANT
BALLOON DILATOR 12-15 8 (BALLOONS) IMPLANT
BALLOON DILATOR 15-18 8 (BALLOONS) IMPLANT
BALLOON DILATOR CRE 0-12 8 (BALLOONS) IMPLANT
BLOCK BITE 60FR ADLT L/F GRN (MISCELLANEOUS) ×3 IMPLANT
CANISTER SUCT 1200ML W/VALVE (MISCELLANEOUS) ×3 IMPLANT
CLIP HMST 235XBRD CATH ROT (MISCELLANEOUS) IMPLANT
CLIP RESOLUTION 360 11X235 (MISCELLANEOUS)
FCP ESCP3.2XJMB 240X2.8X (MISCELLANEOUS)
FORCEPS BIOP RAD 4 LRG CAP 4 (CUTTING FORCEPS) IMPLANT
FORCEPS BIOP RJ4 240 W/NDL (MISCELLANEOUS)
FORCEPS ESCP3.2XJMB 240X2.8X (MISCELLANEOUS) IMPLANT
GOWN CVR UNV OPN BCK APRN NK (MISCELLANEOUS) ×2 IMPLANT
GOWN ISOL THUMB LOOP REG UNIV (MISCELLANEOUS) ×4
INJECTOR VARIJECT VIN23 (MISCELLANEOUS) IMPLANT
KIT DEFENDO VALVE AND CONN (KITS) IMPLANT
KIT ENDO PROCEDURE OLY (KITS) ×3 IMPLANT
MARKER SPOT ENDO TATTOO 5ML (MISCELLANEOUS) IMPLANT
PAD GROUND ADULT SPLIT (MISCELLANEOUS) IMPLANT
RETRIEVER NET PLAT FOOD (MISCELLANEOUS) IMPLANT
SNARE SHORT THROW 13M SML OVAL (MISCELLANEOUS) IMPLANT
SNARE SHORT THROW 30M LRG OVAL (MISCELLANEOUS) IMPLANT
SPOT EX ENDOSCOPIC TATTOO (MISCELLANEOUS)
SYR INFLATION 60ML (SYRINGE) IMPLANT
TRAP ETRAP POLY (MISCELLANEOUS) IMPLANT
VARIJECT INJECTOR VIN23 (MISCELLANEOUS)
WATER STERILE IRR 250ML POUR (IV SOLUTION) ×3 IMPLANT
WIRE CRE 18-20MM 8CM F G (MISCELLANEOUS) IMPLANT

## 2016-04-16 NOTE — Op Note (Signed)
[facility] Gastroenterology Patient Name: April Bauer Procedure Date: 04/16/2016 8:19 AM MRN: [id number] Account #: [id number] Date of Birth: 1955-08-31 Admit Type: Outpatient Age: 60 Room: [facility] OR ROOM 01 Gender: Female Note Status: Finalized Procedure:            Upper GI endoscopy Indications:          Epigastric abdominal pain, Abdominal bloating Providers:            [name] MD, MD Referring MD:         Dorcas Carrow (Referring MD) Medicines:            Propofol per Anesthesia Complications:        No immediate complications. Procedure:            Pre-Anesthesia Assessment:                       - Prior to the procedure, a History and Physical was                        performed, and patient medications and allergies were                        reviewed. The patient's tolerance of previous                        anesthesia was also reviewed. The risks and benefits of                        the procedure and the sedation options and risks were                        discussed with the patient. All questions were                        answered, and informed consent was obtained. Prior                        Anticoagulants: The patient has taken no previous                        anticoagulant or antiplatelet agents. ASA Grade                        Assessment: II - A patient with mild systemic disease.                        After reviewing the risks and benefits, the patient was                        deemed in satisfactory condition to undergo the                        procedure.                       After obtaining informed consent, the endoscope was                        passed under direct vision. Throughout the procedure,  the patient's blood pressure, pulse, and oxygen                        saturations were monitored continuously. The Olympus                        [id number]-HQ190 Endoscope (S#. [id number]) was introduced                    through the mouth, and advanced to the second part of                        duodenum. The upper GI endoscopy was accomplished                        without difficulty. The patient tolerated the procedure                        well. Findings:      The examined esophagus was normal.      A large amount of food (residue) was found in the entire examined       stomach.      The examined duodenum was normal. Impression:           - Normal esophagus.                       - A large amount of food (residue) in the stomach.                       - Normal examined duodenum.                       - No specimens collected. Recommendation:       - Do a gastric emptying study. Procedure Code(s):    --- Professional ---                       [phone], Esophagogastroduodenoscopy, flexible, transoral;                        diagnostic, including collection of specimen(s) by                        brushing or washing, when performed (separate procedure) Diagnosis Code(s):    --- Professional ---                       R14.0, Abdominal distension (gaseous)                       R10.13, Epigastric pain CPT copyright 2016 American Medical Association. All rights reserved. The codes documented in this report are preliminary and upon coder review may  be revised to meet current compliance requirements. [name] MD, MD 04/16/2016 8:33:23 AM This report has been signed electronically. Number of Addenda: 0 Note Initiated On: 04/16/2016 8:19 AM      [facility]

## 2016-04-16 NOTE — Anesthesia Postprocedure Evaluation (Signed)
Anesthesia Post Note  Patient: April Bauer  Procedure(s) Performed: Procedure(s) (LRB): ESOPHAGOGASTRODUODENOSCOPY (EGD) WITH PROPOFOL (N/A)  Patient location during evaluation: PACU Anesthesia Type: MAC Level of consciousness: awake and alert Pain management: pain level controlled Vital Signs Assessment: post-procedure vital signs reviewed and stable Respiratory status: spontaneous breathing, nonlabored ventilation, respiratory function stable and patient connected to nasal cannula oxygen Cardiovascular status: stable and blood pressure returned to baseline Anesthetic complications: no Comments: Pt to resume BP meds.    Orrin Brigham

## 2016-04-16 NOTE — Telephone Encounter (Signed)
Pt has been scheduled for gastric emptying study at [facility] location on Tuesday, August 29th @ 11:00am. Pt's daughter was notified of this appt as well as instructions to stop her omeprazole, any anti nausea medications and to be NPO 8 hours prior to scan.

## 2016-04-16 NOTE — Transfer of Care (Signed)
Immediate Anesthesia Transfer of Care Note  Patient: April Bauer  Procedure(s) Performed: Procedure(s) with comments: ESOPHAGOGASTRODUODENOSCOPY (EGD) WITH PROPOFOL (N/A) - DIABETIC-INSULIN DEPENDENT  Patient Location: PACU  Anesthesia Type: MAC  Level of Consciousness: awake, alert  and patient cooperative  Airway and Oxygen Therapy: Patient Spontanous Breathing and Patient connected to supplemental oxygen  Post-op Assessment: Post-op Vital signs reviewed, Patient's Cardiovascular Status Stable, Respiratory Function Stable, Patent Airway and No signs of Nausea or vomiting  Post-op Vital Signs: Reviewed and stable  Complications: No apparent anesthesia complications

## 2016-04-16 NOTE — H&P (Signed)
[name], MD [facility] [address], [address] [place], [place] [address] Phone: [phone] Fax : [phone]  Primary Care Physician:  Olevia Perches, DO Primary Gastroenterologist:  Dr. Servando Snare  Pre-Procedure History & Physical: HPI:  April Bauer is a 60 y.o. female is here for an endoscopy.   Past Medical History:  Diagnosis Date  . Anxiety   . Asthma    heat induced  . Diabetes mellitus without complication (HCC)    type 2  . GERD (gastroesophageal reflux disease)    ADMITTED TO [place] IN MARCH, DETERMINED GERD  . Headache    occasional  . Hyperlipidemia   . Hypertension    controlled on meds  . RA (rheumatoid arthritis) (HCC)    lower lumbar/ degenerative    Past Surgical History:  Procedure Laterality Date  . cardiac catherization    . CHOLECYSTECTOMY    . LITHOTRIPSY     kidney stones  . rotary cuff    . TUBAL LIGATION      Prior to Admission medications   Medication Sig Start Date End Date Taking? Authorizing Provider  aspirin EC 81 MG tablet Take 81 mg by mouth daily. am   Yes Historical Provider, MD  azelastine (OPTIVAR) 0.05 % ophthalmic solution as needed.  02/04/16  Yes Historical Provider, MD  cloNIDine (CATAPRES) 0.2 MG tablet Take 1 tablet (0.2 mg total) by mouth 2 (two) times daily. Patient taking differently: Take 0.2 mg by mouth daily. am 11/22/15  Yes Megan P Johnson, DO  cyclobenzaprine (FLEXERIL) 10 MG tablet Take 1 tablet (10 mg total) by mouth 3 (three) times daily as needed for muscle spasms. Patient taking differently: Take 10 mg by mouth 3 (three) times daily as needed.  10/05/15  Yes Megan P Johnson, DO  Dulaglutide (TRULICITY) 0.75 MG/0.5ML SOPN Inject 0.75 mg into the skin once a week. 01/11/16  Yes Megan P Johnson, DO  fluticasone (FLONASE) 50 MCG/ACT nasal spray USE 2 SPRAYS IN EACH NOSTRIL DAILY 04/12/16  Yes Megan P Johnson, DO  glipiZIDE (GLUCOTROL XL) 10 MG 24 hr tablet Take 1 tablet (10 mg total) by mouth daily with breakfast. 10/19/15   Yes Megan P Johnson, DO  Insulin Glargine (TOUJEO SOLOSTAR) 300 UNIT/ML SOPN Inject 30 Units into the skin daily. Patient taking differently: Inject 30 Units into the skin daily. am 02/20/16  Yes Megan P Johnson, DO  lisinopril-hydrochlorothiazide (PRINZIDE,ZESTORETIC) 20-25 MG tablet Take 2 tablets by mouth daily. Patient taking differently: Take 2 tablets by mouth daily. am 02/10/16  Yes Megan P Johnson, DO  meloxicam (MOBIC) 15 MG tablet Take 1 tablet (15 mg total) by mouth daily. Patient taking differently: Take 15 mg by mouth daily. Am 10/05/15  Yes Megan P Johnson, DO  meloxicam (MOBIC) 15 MG tablet TAKE 1 TABLET DAILY 04/12/16  Yes Megan P Johnson, DO  omeprazole (PRILOSEC) 40 MG capsule TAKE 1 CAPSULE DAILY Patient taking differently: TAKE 1 CAPSULE DAILY/ am 04/09/16  Yes Megan P Johnson, DO  QVAR 40 MCG/ACT inhaler USE 2 INHALATIONS DAILY AS NEEDED 04/12/16  Yes Megan P Johnson, DO  triamcinolone cream (KENALOG) 0.1 % USE ONE APPLICATION TOPICALLY AS NEEDED FOR ITCHING 01/09/16  Yes Gabriel Cirri, NP  amLODipine (NORVASC) 2.5 MG tablet Take 1 tablet (2.5 mg total) by mouth daily. Patient not taking: Reported on 04/16/2016 03/21/16   Megan P Johnson, DO  metFORMIN (GLUCOPHAGE) 850 MG tablet TAKE 1 TABLET TWICE A DAY WITH MEALS 04/16/16   Dorcas Carrow, DO  simvastatin (ZOCOR) 20 MG tablet TAKE 1 TABLET DAILY 04/16/16   Megan P Johnson, DO    Allergies as of 04/09/2016 - Review Complete 03/21/2016  Allergen Reaction Noted  . Eggs or egg-derived products Shortness Of Breath 06/06/2015  . Tramadol Nausea And Vomiting 04/23/2015  . Vicodin [hydrocodone-acetaminophen] Other (See Comments) 04/23/2015    Family History  Problem Relation Age of Onset  . Cancer Mother     Pancreatic  . Diabetes Sister   . Hypertension Sister   . Diabetes Brother   . Hypertension Brother   . Liver disease Brother     Social History   Social History  . Marital status: Married    Spouse name: N/A  .  Number of children: N/A  . Years of education: N/A   Occupational History  . Not on file.   Social History Main Topics  . Smoking status: Never Smoker  . Smokeless tobacco: Never Used  . Alcohol use Yes     Comment: occasional, 1/month  . Drug use: No  . Sexual activity: Yes   Other Topics Concern  . Not on file   Social History Narrative  . No narrative on file    Review of Systems: See HPI, otherwise negative ROS  Physical Exam: BP (!) 191/75   Pulse (!) 53   Temp 97.3 F (36.3 C) (Temporal)   Resp 16   Ht 5\' 4"  (1.626 m)   Wt 202 lb (91.6 kg)   SpO2 100%   BMI 34.67 kg/m  General:   Alert,  pleasant and cooperative in NAD Head:  Normocephalic and atraumatic. Neck:  Supple; no masses or thyromegaly. Lungs:  Clear throughout to auscultation.    Heart:  Regular rate and rhythm. Abdomen:  Soft, nontender and nondistended. Normal bowel sounds, without guarding, and without rebound.   Neurologic:  Alert and  oriented x4;  grossly normal neurologically.  Impression/Plan: [name] is here for an endoscopy to be performed for epigastric pain  Risks, benefits, limitations, and alternatives regarding  endoscopy have been reviewed with the patient.  Questions have been answered.  All parties agreeable.   [name], MD  04/16/2016, 8:13 AM

## 2016-04-16 NOTE — Telephone Encounter (Signed)
Patient called and stated she needs an appointment for a GI emptying study. Please call

## 2016-04-16 NOTE — Anesthesia Preprocedure Evaluation (Addendum)
Anesthesia Evaluation  Patient identified by MRN, date of birth, ID band  Reviewed: NPO status   History of Anesthesia Complications Negative for: history of anesthetic complications  Airway Mallampati: II  TM Distance: >3 FB Neck ROM: full    Dental no notable dental hx.    Pulmonary asthma , sleep apnea (no cpap) ,    Pulmonary exam normal        Cardiovascular Exercise Tolerance: Good hypertension, Normal cardiovascular exam     Neuro/Psych  Headaches, Anxiety    GI/Hepatic Neg liver ROS, GERD  ,  Endo/Other  diabetesMorbid obesity (bmi=35)  Renal/GU negative Renal ROS  negative genitourinary   Musculoskeletal  (+) Arthritis ,   Abdominal   Peds  Hematology negative hematology ROS (+)   Anesthesia Other Findings Pt can eat eggs without problem.  Reproductive/Obstetrics                            Anesthesia Physical Anesthesia Plan  ASA: II  Anesthesia Plan: MAC   Post-op Pain Management:    Induction:   Airway Management Planned:   Additional Equipment:   Intra-op Plan:   Post-operative Plan:   Informed Consent: I have reviewed the patients History and Physical, chart, labs and discussed the procedure including the risks, benefits and alternatives for the proposed anesthesia with the patient or authorized representative who has indicated his/her understanding and acceptance.     Plan Discussed with: CRNA  Anesthesia Plan Comments:        Anesthesia Quick Evaluation

## 2016-04-17 ENCOUNTER — Encounter: Payer: Self-pay | Admitting: Gastroenterology

## 2016-04-24 ENCOUNTER — Ambulatory Visit
Admission: RE | Admit: 2016-04-24 | Discharge: 2016-04-24 | Disposition: A | Discharge status: Home / Self Care | Payer: 59 | Source: Ambulatory Visit | Attending: Gastroenterology | Admitting: Gastroenterology

## 2016-04-24 DIAGNOSIS — R1013 Epigastric pain: Secondary | ICD-10-CM | POA: Insufficient documentation

## 2016-04-24 DIAGNOSIS — R140 Abdominal distension (gaseous): Secondary | ICD-10-CM | POA: Insufficient documentation

## 2016-04-24 MED ORDER — TECHNETIUM TC 99M SULFUR COLLOID
2.0300 | Freq: Once | INTRAVENOUS | Status: AC | PRN
  Administered 2016-04-24: 2.03 via ORAL

## 2016-04-25 ENCOUNTER — Ambulatory Visit: Payer: 59

## 2016-04-26 NOTE — Telephone Encounter (Signed)
Left message for pt to return my call.

## 2016-04-26 NOTE — Telephone Encounter (Signed)
-----   Message from Darren Wohl, MD sent at 04/26/2016 12:11 PM EDT ----- Let the patient know that her Gastric emptying study showed her stomach to be working well and to empty very quickly 

## 2016-04-27 ENCOUNTER — Ambulatory Visit
Admission: RE | Admit: 2016-04-27 | Discharge: 2016-04-27 | Disposition: A | Discharge status: Home / Self Care | Payer: 59 | Source: Ambulatory Visit | Attending: Family Medicine | Admitting: Family Medicine

## 2016-04-27 ENCOUNTER — Other Ambulatory Visit: Payer: Self-pay | Admitting: Family Medicine

## 2016-04-27 DIAGNOSIS — Z1231 Encounter for screening mammogram for malignant neoplasm of breast: Secondary | ICD-10-CM | POA: Diagnosis not present

## 2016-04-27 DIAGNOSIS — R921 Mammographic calcification found on diagnostic imaging of breast: Secondary | ICD-10-CM | POA: Insufficient documentation

## 2016-04-27 DIAGNOSIS — Z1239 Encounter for other screening for malignant neoplasm of breast: Secondary | ICD-10-CM

## 2016-04-27 DIAGNOSIS — N63 Unspecified lump in breast: Secondary | ICD-10-CM | POA: Insufficient documentation

## 2016-05-02 ENCOUNTER — Telehealth: Payer: Self-pay

## 2016-05-02 NOTE — Telephone Encounter (Signed)
-----   Message from [name], MD sent at 04/26/2016 12:11 PM EDT ----- Let the patient know that her Gastric emptying study showed her stomach to be working well and to empty very quickly

## 2016-05-02 NOTE — Telephone Encounter (Signed)
Pt notified of gastric emptying study.

## 2016-05-04 ENCOUNTER — Other Ambulatory Visit: Payer: Self-pay | Admitting: Family Medicine

## 2016-05-04 DIAGNOSIS — N6320 Unspecified lump in the left breast, unspecified quadrant: Secondary | ICD-10-CM

## 2016-05-04 DIAGNOSIS — R921 Mammographic calcification found on diagnostic imaging of breast: Secondary | ICD-10-CM

## 2016-05-17 ENCOUNTER — Telehealth: Payer: Self-pay | Admitting: Family Medicine

## 2016-05-17 NOTE — Telephone Encounter (Signed)
Amy, can you check on this? I don't see that she's been scheduled for her follow up images and it's been 3 weeks.

## 2016-05-17 NOTE — Telephone Encounter (Signed)
Left message for April Bauer to call me back in regards to her additional views.

## 2016-05-18 NOTE — Telephone Encounter (Signed)
Can we check in with her and find out what's going on with April Bauer- this is the first I've heard about her losing her insurance, which also probably means she isn't taking her diabetes medicine.

## 2016-05-18 NOTE — Telephone Encounter (Signed)
Marchelle Folks stated that she attempted to call patient on 05/07/16 and left message to call. She spoke with her on 9/19 and the patient did not want to schedule her additional views at this time because she doesn't have insurance. [place] did give her the info and referral to the [facility] program which does free/reduced cost mammos for patients with no insurance. They have not heard anything else from the patient.

## 2016-05-21 NOTE — Telephone Encounter (Signed)
Spoke with patient, she lost her job and her insurance. She is going to apply for medicaid. She states that she will call and schedule an appointment when she has coverage again. Follow up appointment cancelled.

## 2016-05-22 ENCOUNTER — Ambulatory Visit: Payer: Self-pay | Admitting: Family Medicine

## 2016-07-11 ENCOUNTER — Other Ambulatory Visit: Payer: Self-pay | Admitting: Family Medicine

## 2016-07-25 ENCOUNTER — Telehealth: Payer: Self-pay | Admitting: Family Medicine

## 2016-07-25 NOTE — Telephone Encounter (Signed)
Pt would like to have all meds refilled and sent to [facility] [place].

## 2016-07-26 ENCOUNTER — Other Ambulatory Visit: Payer: Self-pay | Admitting: Family Medicine

## 2016-07-26 MED ORDER — LISINOPRIL-HYDROCHLOROTHIAZIDE 20-25 MG PO TABS: 2.0000 | ORAL_TABLET | Freq: Every day | ORAL | 1 refills | Status: AC

## 2016-07-26 MED ORDER — SIMVASTATIN 20 MG PO TABS: 20.0000 mg | ORAL_TABLET | Freq: Every day | ORAL | 1 refills | Status: AC

## 2016-07-26 MED ORDER — OMEPRAZOLE 40 MG PO CPDR: 40.0000 mg | DELAYED_RELEASE_CAPSULE | Freq: Every day | ORAL | 3 refills | Status: AC

## 2016-07-26 MED ORDER — METFORMIN HCL 850 MG PO TABS: 850.0000 mg | ORAL_TABLET | Freq: Two times a day (BID) | ORAL | 1 refills | Status: AC

## 2016-07-26 MED ORDER — GLIPIZIDE ER 10 MG PO TB24: 10.0000 mg | ORAL_TABLET | Freq: Every day | ORAL | 1 refills | Status: AC

## 2016-07-26 MED ORDER — ASPIRIN EC 81 MG PO TBEC: 81.0000 mg | DELAYED_RELEASE_TABLET | Freq: Every day | ORAL | 3 refills | Status: AC

## 2016-07-26 MED ORDER — FLUTICASONE PROPIONATE 50 MCG/ACT NA SUSP: 2.0000 | Freq: Every day | NASAL | 5 refills | Status: AC

## 2016-07-26 MED ORDER — BECLOMETHASONE DIPROPIONATE 40 MCG/ACT IN AERS: 2.0000 | INHALATION_SPRAY | Freq: Four times a day (QID) | RESPIRATORY_TRACT | 1 refills | Status: AC | PRN

## 2016-07-26 MED ORDER — AMLODIPINE BESYLATE 2.5 MG PO TABS: 2.5000 mg | ORAL_TABLET | Freq: Every day | ORAL | 1 refills | Status: AC

## 2016-07-26 MED ORDER — INSULIN GLARGINE 300 UNIT/ML ~~LOC~~ SOPN: 30.0000 [IU] | PEN_INJECTOR | Freq: Every day | SUBCUTANEOUS | 1 refills | Status: AC

## 2016-07-26 MED ORDER — DULAGLUTIDE 0.75 MG/0.5ML ~~LOC~~ SOAJ: 0.7500 mg | SUBCUTANEOUS | 3 refills | Status: AC

## 2016-07-26 MED ORDER — CLONIDINE HCL 0.2 MG PO TABS: 0.2000 mg | ORAL_TABLET | Freq: Two times a day (BID) | ORAL | 3 refills | Status: AC

## 2016-07-26 NOTE — Telephone Encounter (Signed)
Which meds? All of them? I did her prilosec this AM

## 2016-08-15 ENCOUNTER — Encounter: Payer: Self-pay | Admitting: *Deleted

## 2016-08-15 ENCOUNTER — Ambulatory Visit: Payer: Self-pay | Attending: Oncology | Admitting: *Deleted

## 2016-08-15 ENCOUNTER — Ambulatory Visit
Admission: RE | Admit: 2016-08-15 | Discharge: 2016-08-15 | Disposition: A | Discharge status: Home / Self Care | Payer: Self-pay | Source: Ambulatory Visit | Attending: Oncology | Admitting: Oncology

## 2016-08-15 ENCOUNTER — Other Ambulatory Visit: Payer: Self-pay | Admitting: *Deleted

## 2016-08-15 VITALS — BP 157/76 | HR 60 | Temp 98.6°F | Resp 18 | Wt 203.0 lb

## 2016-08-15 DIAGNOSIS — R920 Mammographic microcalcification found on diagnostic imaging of breast: Secondary | ICD-10-CM

## 2016-08-15 DIAGNOSIS — N63 Unspecified lump in unspecified breast: Secondary | ICD-10-CM

## 2016-08-15 NOTE — Patient Instructions (Signed)
Gave patient hand-out, Women Staying Healthy, Active and Well from [facility], with education on breast health, pap smears, heart and colon health. 

## 2016-08-15 NOTE — Progress Notes (Signed)
Subjective:     Patient ID: April Bauer, female   DOB: [birth year], 60 y.o.   MRN: [id number]  HPI   Review of Systems     Objective:   Physical Exam  Pulmonary/Chest: Right breast exhibits no inverted nipple, no mass, no nipple discharge, no skin change and no tenderness. Left breast exhibits no inverted nipple, no mass, no nipple discharge, no skin change and no tenderness. Breasts are symmetrical.       Assessment:     60 year old Black female presents to [facility] for clinical breast exam and mammogram.  Last mammogram was on 04/27/16 and was a birads 0.  Patient lost her insurance and did not return for additional views.  Clinical breast exam unremarkable.  Taught self breast awareness.  Patient has been screened for eligibility.  She does not have any insurance, Medicare or Medicaid.  She also meets financial eligibility.  Hand-out given on the Affordable Care Act.    Plan:     Uni left mammogram and ultrasound ordered for follow up of a possible left breast mass on initial mammogram on 04/27/16.  Will follow-up per [facility] protocol.

## 2016-08-23 ENCOUNTER — Encounter: Payer: Self-pay | Admitting: *Deleted

## 2016-08-23 NOTE — Progress Notes (Signed)
Mailed letter to inform patient of her next mammogram appointment for February 14, 2017 @ 10:20.  Will follow up per protocol.  [facility] to [place].

## 2017-02-13 ENCOUNTER — Ambulatory Visit: Payer: Self-pay | Attending: Oncology

## 2017-02-14 ENCOUNTER — Ambulatory Visit: Payer: Self-pay

## 2017-02-14 ENCOUNTER — Ambulatory Visit: Payer: Self-pay | Attending: Oncology
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
## 2017-10-07 ENCOUNTER — Inpatient Hospital Stay: Payer: Self-pay | Admitting: Family Medicine
